# Patient Record
Sex: Male | Born: 1975 | Race: Black or African American | Hispanic: No | Marital: Single | Smoking: Current every day smoker
Health system: Southern US, Community
[De-identification: ages and names within clinical notes are randomized; demographics above are authoritative.]

## PROBLEM LIST (undated history)

## (undated) DIAGNOSIS — I10 Essential (primary) hypertension: Secondary | ICD-10-CM

## (undated) DIAGNOSIS — E119 Type 2 diabetes mellitus without complications: Secondary | ICD-10-CM

---

## 2019-12-11 HISTORY — PX: EXPLORATORY LAPAROTOMY: SUR591

## 2020-04-19 ENCOUNTER — Emergency Department (HOSPITAL_COMMUNITY)
Admission: EM | Admit: 2020-04-19 | Discharge: 2020-04-19 | Payer: Self-pay | Attending: Emergency Medicine | Admitting: Emergency Medicine

## 2020-04-19 ENCOUNTER — Emergency Department (HOSPITAL_COMMUNITY): Admission: EM | Admit: 2020-04-19 | Discharge: 2020-04-19 | Discharge status: Absent without leave | Payer: Self-pay

## 2020-04-19 ENCOUNTER — Encounter (HOSPITAL_COMMUNITY): Payer: Self-pay

## 2020-04-19 ENCOUNTER — Other Ambulatory Visit: Payer: Self-pay

## 2020-04-19 ENCOUNTER — Emergency Department (HOSPITAL_COMMUNITY): Payer: Self-pay

## 2020-04-19 DIAGNOSIS — M25512 Pain in left shoulder: Secondary | ICD-10-CM | POA: Insufficient documentation

## 2020-04-19 DIAGNOSIS — M7918 Myalgia, other site: Secondary | ICD-10-CM | POA: Insufficient documentation

## 2020-04-19 DIAGNOSIS — F17200 Nicotine dependence, unspecified, uncomplicated: Secondary | ICD-10-CM | POA: Insufficient documentation

## 2020-04-19 MED ORDER — METHOCARBAMOL 500 MG PO TABS: 500.0000 mg | ORAL_TABLET | Freq: Two times a day (BID) | ORAL | 0 refills | Status: DC | PRN

## 2020-04-19 MED ORDER — NAPROXEN 500 MG PO TABS
500.0000 mg | ORAL_TABLET | Freq: Once | ORAL | Status: AC
  Administered 2020-04-19: 500 mg via ORAL
  Filled 2020-04-19: qty 1

## 2020-04-19 MED ORDER — METHOCARBAMOL 500 MG PO TABS
500.0000 mg | ORAL_TABLET | Freq: Once | ORAL | Status: AC
  Administered 2020-04-19: 500 mg via ORAL
  Filled 2020-04-19: qty 1
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
## 2020-04-19 NOTE — Discharge Instructions (Signed)
Alternate ice and heat to areas of injury 3-4 times per day to limit inflammation and spasm.  Avoid strenuous activity and heavy lifting.  We recommend consistent use of naproxen in addition to Robaxin for muscle spasms.  Do not drive or drink alcohol after taking Robaxin as it may make you drowsy and impair your judgment.  We recommend follow-up with an Orthopedist if symptoms persist.  Return to the ED for any new or concerning symptoms.

## 2020-06-20 ENCOUNTER — Ambulatory Visit: Payer: Self-pay | Admitting: Family Medicine

## 2021-07-12 ENCOUNTER — Ambulatory Visit (INDEPENDENT_AMBULATORY_CARE_PROVIDER_SITE_OTHER): Payer: Self-pay | Admitting: Primary Care

## 2021-09-17 ENCOUNTER — Ambulatory Visit (INDEPENDENT_AMBULATORY_CARE_PROVIDER_SITE_OTHER): Payer: Self-pay | Admitting: Primary Care

## 2021-10-31 ENCOUNTER — Inpatient Hospital Stay (HOSPITAL_COMMUNITY)
Admission: EM | Admit: 2021-10-31 | Discharge: 2021-11-03 | DRG: 291 | Disposition: A | Discharge status: Home / Self Care | Payer: Self-pay | Attending: Internal Medicine | Admitting: Internal Medicine

## 2021-10-31 ENCOUNTER — Encounter (HOSPITAL_COMMUNITY): Payer: Self-pay | Admitting: Internal Medicine

## 2021-10-31 ENCOUNTER — Emergency Department (HOSPITAL_COMMUNITY): Payer: Self-pay

## 2021-10-31 ENCOUNTER — Other Ambulatory Visit: Payer: Self-pay

## 2021-10-31 DIAGNOSIS — Z9114 Patient's other noncompliance with medication regimen: Secondary | ICD-10-CM

## 2021-10-31 DIAGNOSIS — Z6841 Body Mass Index (BMI) 40.0 and over, adult: Secondary | ICD-10-CM

## 2021-10-31 DIAGNOSIS — K029 Dental caries, unspecified: Secondary | ICD-10-CM | POA: Diagnosis present

## 2021-10-31 DIAGNOSIS — Z8249 Family history of ischemic heart disease and other diseases of the circulatory system: Secondary | ICD-10-CM

## 2021-10-31 DIAGNOSIS — J9601 Acute respiratory failure with hypoxia: Secondary | ICD-10-CM | POA: Diagnosis present

## 2021-10-31 DIAGNOSIS — I248 Other forms of acute ischemic heart disease: Secondary | ICD-10-CM | POA: Diagnosis present

## 2021-10-31 DIAGNOSIS — I110 Hypertensive heart disease with heart failure: Principal | ICD-10-CM | POA: Diagnosis present

## 2021-10-31 DIAGNOSIS — F1010 Alcohol abuse, uncomplicated: Secondary | ICD-10-CM | POA: Diagnosis present

## 2021-10-31 DIAGNOSIS — I5021 Acute systolic (congestive) heart failure: Secondary | ICD-10-CM

## 2021-10-31 DIAGNOSIS — F1410 Cocaine abuse, uncomplicated: Secondary | ICD-10-CM | POA: Diagnosis present

## 2021-10-31 DIAGNOSIS — I160 Hypertensive urgency: Secondary | ICD-10-CM | POA: Diagnosis present

## 2021-10-31 DIAGNOSIS — F329 Major depressive disorder, single episode, unspecified: Secondary | ICD-10-CM | POA: Diagnosis present

## 2021-10-31 DIAGNOSIS — Z20822 Contact with and (suspected) exposure to covid-19: Secondary | ICD-10-CM | POA: Diagnosis present

## 2021-10-31 DIAGNOSIS — K047 Periapical abscess without sinus: Secondary | ICD-10-CM | POA: Diagnosis present

## 2021-10-31 DIAGNOSIS — E1165 Type 2 diabetes mellitus with hyperglycemia: Secondary | ICD-10-CM

## 2021-10-31 DIAGNOSIS — I10 Essential (primary) hypertension: Secondary | ICD-10-CM

## 2021-10-31 DIAGNOSIS — F17210 Nicotine dependence, cigarettes, uncomplicated: Secondary | ICD-10-CM | POA: Diagnosis present

## 2021-10-31 DIAGNOSIS — I509 Heart failure, unspecified: Principal | ICD-10-CM

## 2021-10-31 DIAGNOSIS — Z841 Family history of disorders of kidney and ureter: Secondary | ICD-10-CM

## 2021-10-31 DIAGNOSIS — R1084 Generalized abdominal pain: Secondary | ICD-10-CM | POA: Diagnosis present

## 2021-10-31 DIAGNOSIS — Z833 Family history of diabetes mellitus: Secondary | ICD-10-CM

## 2021-10-31 DIAGNOSIS — N179 Acute kidney failure, unspecified: Secondary | ICD-10-CM

## 2021-10-31 HISTORY — DX: Type 2 diabetes mellitus without complications: E11.9

## 2021-10-31 HISTORY — DX: Essential (primary) hypertension: I10

## 2021-10-31 LAB — RESP PANEL BY RT-PCR (FLU A&B, COVID) ARPGX2
Influenza A by PCR: NEGATIVE
Influenza B by PCR: NEGATIVE
SARS Coronavirus 2 by RT PCR: NEGATIVE

## 2021-10-31 LAB — CBC
HCT: 47 % (ref 39.0–52.0)
HCT: 45.5 % (ref 39.0–52.0)
Hemoglobin: 14.8 g/dL (ref 13.0–17.0)
Hemoglobin: 14.5 g/dL (ref 13.0–17.0)
MCH: 28.7 pg (ref 26.0–34.0)
MCH: 29 pg (ref 26.0–34.0)
MCHC: 31.5 g/dL (ref 30.0–36.0)
MCHC: 31.9 g/dL (ref 30.0–36.0)
MCV: 91.1 fL (ref 80.0–100.0)
MCV: 91 fL (ref 80.0–100.0)
Platelets: 308 10*3/uL (ref 150–400)
Platelets: 284 10*3/uL (ref 150–400)
RBC: 5.16 MIL/uL (ref 4.22–5.81)
RBC: 5 MIL/uL (ref 4.22–5.81)
RDW: 13.4 % (ref 11.5–15.5)
RDW: 13.4 % (ref 11.5–15.5)
WBC: 6.7 10*3/uL (ref 4.0–10.5)
WBC: 6.2 10*3/uL (ref 4.0–10.5)
nRBC: 0 % (ref 0.0–0.2)
nRBC: 0 % (ref 0.0–0.2)

## 2021-10-31 LAB — COMPREHENSIVE METABOLIC PANEL
ALT: 111 U/L — ABNORMAL HIGH (ref 0–44)
AST: 71 U/L — ABNORMAL HIGH (ref 15–41)
Albumin: 3.5 g/dL (ref 3.5–5.0)
Alkaline Phosphatase: 66 U/L (ref 38–126)
Anion gap: 7 (ref 5–15)
BUN: 23 mg/dL — ABNORMAL HIGH (ref 6–20)
CO2: 24 mmol/L (ref 22–32)
Calcium: 9 mg/dL (ref 8.9–10.3)
Chloride: 106 mmol/L (ref 98–111)
Creatinine, Ser: 1.35 mg/dL — ABNORMAL HIGH (ref 0.61–1.24)
GFR, Estimated: >60 mL/min (ref >60)
Glucose, Bld: 212 mg/dL — ABNORMAL HIGH (ref 70–99)
Potassium: 4.3 mmol/L (ref 3.5–5.1)
Sodium: 137 mmol/L (ref 135–145)
Total Bilirubin: 0.9 mg/dL (ref 0.3–1.2)
Total Protein: 7.2 g/dL (ref 6.5–8.1)

## 2021-10-31 LAB — TROPONIN I (HIGH SENSITIVITY)
Troponin I (High Sensitivity): 34 ng/L — ABNORMAL HIGH (ref <18)
Troponin I (High Sensitivity): 37 ng/L — ABNORMAL HIGH (ref <18)
Troponin I (High Sensitivity): 34 ng/L — ABNORMAL HIGH (ref <18)
Troponin I (High Sensitivity): 40 ng/L — ABNORMAL HIGH (ref <18)

## 2021-10-31 LAB — D-DIMER, QUANTITATIVE: D-Dimer, Quant: 1.38 ug/mL-FEU — ABNORMAL HIGH (ref 0.00–0.50)

## 2021-10-31 LAB — CREATININE, SERUM
Creatinine, Ser: 1.32 mg/dL — ABNORMAL HIGH (ref 0.61–1.24)
GFR, Estimated: >60 mL/min (ref >60)

## 2021-10-31 LAB — HEMOGLOBIN A1C
Hgb A1c MFr Bld: 7.9 % — ABNORMAL HIGH (ref 4.8–5.6)
Mean Plasma Glucose: 180.03 mg/dL

## 2021-10-31 LAB — LIPASE, BLOOD: Lipase: 42 U/L (ref 11–51)

## 2021-10-31 LAB — GLUCOSE, CAPILLARY: Glucose-Capillary: 266 mg/dL — ABNORMAL HIGH (ref 70–99)

## 2021-10-31 LAB — MAGNESIUM: Magnesium: 1.9 mg/dL (ref 1.7–2.4)

## 2021-10-31 LAB — BRAIN NATRIURETIC PEPTIDE: B Natriuretic Peptide: 777.6 pg/mL — ABNORMAL HIGH (ref 0.0–100.0)

## 2021-10-31 MED ORDER — NITROGLYCERIN 0.4 MG SL SUBL
0.4000 mg | SUBLINGUAL_TABLET | SUBLINGUAL | Status: DC | PRN
  Administered 2021-10-31: 0.4 mg via SUBLINGUAL
  Filled 2021-10-31 (×2): qty 1

## 2021-10-31 MED ORDER — INSULIN ASPART 100 UNIT/ML IJ SOLN
0.0000 [IU] | Freq: Three times a day (TID) | INTRAMUSCULAR | Status: DC
  Administered 2021-11-01: 3 [IU] via SUBCUTANEOUS
  Administered 2021-11-01 – 2021-11-02 (×4): 2 [IU] via SUBCUTANEOUS
  Administered 2021-11-03: 3 [IU] via SUBCUTANEOUS

## 2021-10-31 MED ORDER — FUROSEMIDE 10 MG/ML IJ SOLN
40.0000 mg | Freq: Two times a day (BID) | INTRAMUSCULAR | Status: DC
  Administered 2021-11-01 – 2021-11-03 (×5): 40 mg via INTRAVENOUS
  Filled 2021-10-31 (×5): qty 4

## 2021-10-31 MED ORDER — FUROSEMIDE 10 MG/ML IJ SOLN
20.0000 mg | Freq: Once | INTRAMUSCULAR | Status: AC
  Administered 2021-10-31: 20 mg via INTRAVENOUS
  Filled 2021-10-31: qty 4

## 2021-10-31 MED ORDER — NICOTINE 7 MG/24HR TD PT24
7.0000 mg | MEDICATED_PATCH | Freq: Once | TRANSDERMAL | Status: AC
  Administered 2021-11-01: 7 mg via TRANSDERMAL
  Filled 2021-10-31: qty 1

## 2021-10-31 MED ORDER — CARVEDILOL 6.25 MG PO TABS
6.2500 mg | ORAL_TABLET | Freq: Two times a day (BID) | ORAL | Status: DC
  Administered 2021-11-01: 6.25 mg via ORAL
  Filled 2021-10-31: qty 1

## 2021-10-31 MED ORDER — CARVEDILOL 3.125 MG PO TABS
6.2500 mg | ORAL_TABLET | Freq: Once | ORAL | Status: AC
  Administered 2021-10-31: 6.25 mg via ORAL
  Filled 2021-10-31 (×2): qty 2

## 2021-10-31 MED ORDER — IOHEXOL 350 MG/ML SOLN
100.0000 mL | Freq: Once | INTRAVENOUS | Status: AC | PRN
  Administered 2021-10-31: 100 mL via INTRAVENOUS

## 2021-10-31 MED ORDER — METHOCARBAMOL 500 MG PO TABS: 500.0000 mg | ORAL_TABLET | Freq: Two times a day (BID) | ORAL | Status: DC | PRN

## 2021-10-31 MED ORDER — SODIUM CHLORIDE 0.9 % IV SOLN: 250.0000 mL | INTRAVENOUS | Status: DC | PRN

## 2021-10-31 MED ORDER — SODIUM CHLORIDE 0.9% FLUSH: 3.0000 mL | INTRAVENOUS | Status: DC | PRN

## 2021-10-31 MED ORDER — SODIUM CHLORIDE 0.9 % IV BOLUS
500.0000 mL | Freq: Once | INTRAVENOUS | Status: AC
  Administered 2021-10-31: 500 mL via INTRAVENOUS

## 2021-10-31 MED ORDER — INSULIN ASPART 100 UNIT/ML IJ SOLN
3.0000 [IU] | Freq: Three times a day (TID) | INTRAMUSCULAR | Status: DC
  Administered 2021-11-01 – 2021-11-03 (×7): 3 [IU] via SUBCUTANEOUS

## 2021-10-31 MED ORDER — INSULIN DETEMIR 100 UNIT/ML ~~LOC~~ SOLN
5.0000 [IU] | Freq: Every day | SUBCUTANEOUS | Status: DC
  Administered 2021-10-31 – 2021-11-01 (×2): 5 [IU] via SUBCUTANEOUS
  Filled 2021-10-31 (×2): qty 0.05

## 2021-10-31 MED ORDER — SODIUM CHLORIDE 0.9% FLUSH
3.0000 mL | Freq: Two times a day (BID) | INTRAVENOUS | Status: DC
  Administered 2021-11-01 – 2021-11-03 (×5): 3 mL via INTRAVENOUS

## 2021-10-31 MED ORDER — SACUBITRIL-VALSARTAN 24-26 MG PO TABS
1.0000 | ORAL_TABLET | Freq: Two times a day (BID) | ORAL | Status: DC
  Administered 2021-10-31: 1 via ORAL
  Filled 2021-10-31 (×2): qty 1

## 2021-10-31 MED ORDER — INSULIN ASPART 100 UNIT/ML IJ SOLN
0.0000 [IU] | Freq: Every day | INTRAMUSCULAR | Status: DC
  Administered 2021-10-31: 3 [IU] via SUBCUTANEOUS

## 2021-10-31 MED ORDER — ACETAMINOPHEN 325 MG PO TABS
650.0000 mg | ORAL_TABLET | ORAL | Status: DC | PRN
  Administered 2021-11-03: 650 mg via ORAL
  Filled 2021-10-31: qty 2

## 2021-10-31 MED ORDER — ASPIRIN 81 MG PO CHEW
324.0000 mg | CHEWABLE_TABLET | Freq: Once | ORAL | Status: AC
  Administered 2021-10-31: 324 mg via ORAL
  Filled 2021-10-31: qty 4

## 2021-10-31 MED ORDER — ONDANSETRON HCL 4 MG/2ML IJ SOLN: 4.0000 mg | Freq: Four times a day (QID) | INTRAMUSCULAR | Status: DC | PRN

## 2021-10-31 MED ORDER — ENOXAPARIN SODIUM 40 MG/0.4ML IJ SOSY
40.0000 mg | PREFILLED_SYRINGE | INTRAMUSCULAR | Status: DC
  Administered 2021-10-31 – 2021-11-02 (×3): 40 mg via SUBCUTANEOUS
  Filled 2021-10-31 (×3): qty 0.4

## 2021-10-31 MED ORDER — SODIUM CHLORIDE (PF) 0.9 % IJ SOLN
INTRAMUSCULAR | Status: AC
  Filled 2021-10-31: qty 50

## 2021-10-31 MED ORDER — ASPIRIN EC 81 MG PO TBEC
81.0000 mg | DELAYED_RELEASE_TABLET | Freq: Every day | ORAL | Status: DC
  Administered 2021-10-31 – 2021-11-03 (×4): 81 mg via ORAL
  Filled 2021-10-31 (×4): qty 1
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
## 2021-10-31 NOTE — H&P (Signed)
? ? ?History and Physical ? ?[name] SK:[id number] DOB: [birth year] DOA: 10/31/2021 ? ?PCP: Patient, No Pcp Per (Inactive) ?Patient coming from: home ? ?I have personally briefly reviewed patient's old medical records in [place] ? ? ?Chief Complaint: SOB ? ?HPI: Nicholas Hicks is a 46 y.o. male past medical history of diabetes and hypertension, major depressive disorder who does not have a PCP and has been noncompliant with his medication for over 2 years.  He relates he started getting short of breath about 7 days prior to admission now he is even short of breath at rest is orthopneic at home.  Denies any swelling, some chest pressure but no exertional chest pain.  He does relate exertional shortness of breath.  Denies any fever, cough, diarrhea new medications or sick contacts. ? ?In the ED: ?Found to be hypertensive tachycardic tachypneic with a heart rate of 115, creatinine 1.3 BNP of 700, CT angio of the chest was negative for PE but it did show pulmonary edema SARS-CoV-2 and influenza PCR were negative.  CT scan of the abdomen and pelvis social mild stranding in the duodenum but he has remained afebrile with no leukocytosis ? ? ?Review of Systems: All systems reviewed and apart from history of presenting illness, are negative. ? ?Past Medical History:  ?Diagnosis Date  ? Diabetes mellitus without complication ([place])   ? Hypertension   ? ?No past surgical history on file. ?Social History:  reports that he has been smoking cigarettes. He has been smoking an average of .25 packs per day. He has never used smokeless tobacco. He reports current alcohol use of about 10.0 standard drinks per week. He reports current drug use. ? ? ?Not on File ? ?Family History  ?Problem Relation Age of Onset  ? Diabetes type II Mother   ? Congestive Heart Failure Father   ? Renal Disease Father   ? ? ?Prior to Admission medications   ?Medication Sig Start Date End Date Taking? Authorizing Provider  ?methocarbamol  (ROBAXIN) 500 MG tablet Take 1 tablet (500 mg total) by mouth every 12 (twelve) hours as needed for muscle spasms (for shoulder pain). 04/19/20   Antonietta Breach, PA-C  ? ?Physical Exam: ?Vitals:  ? 10/31/21 1300 10/31/21 1330 10/31/21 1500 10/31/21 1600  ?BP: (!) 184/123 (!) 186/125 (!) 153/101 (!) 162/129  ?Pulse: (!) 106 93 93 (!) 105  ?Resp: (!) 22 18 18 20   ?Temp:      ?TempSrc:      ?SpO2: 96% 97% 92% 97%  ?Weight:      ?Height:      ? ? ?General exam: Moderately built and nourished patient, lying comfortably supine on the gurney in no obvious distress. ?Head, eyes and ENT: Nontraumatic and normocephalic.  ?Neck: Supple.  Cannot appreciate JVD ?Lymphatics: No lymphadenopathy. ?Respiratory system: Good air movement with crackles at bases ?Cardiovascular system: S1 and S2 heard, RRR.  ?Gastrointestinal system: Abdomen is nondistended, soft and nontender. Normal bowel sounds heard. No organomegaly or masses appreciated. ?Central nervous system: Alert and oriented. No focal neurological deficits. ?Extremities: No lower extremity edema ?Skin: No rashes or acute findings. ?Musculoskeletal system: Negative exam. ?Psychiatry: Pleasant and cooperative. ? ? ?Labs on Admission:  ?Basic Metabolic Panel: ?Recent Labs  ?Lab 10/31/21 ?1220  ?NA 137  ?K 4.3  ?CL 106  ?CO2 24  ?GLUCOSE 212*  ?BUN 23*  ?CREATININE 1.35*  ?CALCIUM 9.0  ? ?Liver Function Tests: ?Recent Labs  ?Lab 10/31/21 ?1220  ?AST 71*  ?ALT  111*  ?ALKPHOS 66  ?BILITOT 0.9  ?PROT 7.2  ?ALBUMIN 3.5  ? ?Recent Labs  ?Lab 10/31/21 ?1220  ?LIPASE 42  ? ?No results for input(s): AMMONIA in the last 168 hours. ?CBC: ?Recent Labs  ?Lab 10/31/21 ?1220  ?WBC 6.7  ?HGB 14.8  ?HCT 47.0  ?MCV 91.1  ?PLT 308  ? ?Cardiac Enzymes: ?No results for input(s): CKTOTAL, CKMB, CKMBINDEX, TROPONINI in the last 168 hours. ? ?BNP (last 3 results) ?No results for input(s): PROBNP in the last 8760 hours. ?CBG: ?No results for input(s): GLUCAP in the last 168 hours. ? ?Radiological Exams on  Admission: ?DG Chest 2 View ? ?Result Date: 10/31/2021 ?CLINICAL DATA:  chest pain EXAM: CHEST - 2 VIEW COMPARISON:  July 05, 2018. FINDINGS: Mild interstitial opacities bilaterally. No visible pleural effusions or pneumothorax. Enlarged cardiac silhouette and pulmonary vascular congestion. IMPRESSION: 1. Mild interstitial opacities bilaterally, potentially mild interstitial pulmonary edema or the sequela of recurrent bouts of CHF. Atypical infection is thought less likely. 2. Cardiomegaly and pulmonary vascular congestion. Electronically Signed   By: Margaretha Sheffield M.D.   On: 10/31/2021 12:06  ? ?CT Angio Chest PE W and/or Wo Contrast ? ?Result Date: 10/31/2021 ?CLINICAL DATA:  Pulmonary embolism (PE) suspected, positive D-dimer; Abdominal pain, acute, nonlocalized EXAM: CT ANGIOGRAPHY CHEST CT ABDOMEN AND PELVIS WITH CONTRAST TECHNIQUE: Multidetector CT imaging of the chest was performed using the standard protocol during bolus administration of intravenous contrast. Multiplanar CT image reconstructions and MIPs were obtained to evaluate the vascular anatomy. Multidetector CT imaging of the abdomen and pelvis was performed using the standard protocol during bolus administration of intravenous contrast. RADIATION DOSE REDUCTION: This exam was performed according to the departmental dose-optimization program which includes automated exposure control, adjustment of the mA and/or kV according to patient size and/or use of iterative reconstruction technique. CONTRAST:  178mL OMNIPAQUE IOHEXOL 350 MG/ML SOLN COMPARISON:  CT abdomen pelvis 02/21/2016 FINDINGS: CTA CHEST FINDINGS Cardiovascular: Satisfactory opacification of the pulmonary mild cardiomegaly. No pericardial disease. The thoracic aorta is unremarkable. Normal heart size. No pericardial effusion. Mediastinum/Nodes: Prominent mediastinal and hilar lymph nodes, likely reactive. Lungs/Pleura: Mosaic attenuation of the lungs with mild interlobular septal  thickening. There is a 5 mm right middle lobe pulmonary nodule (series 7, image 78). There is diffuse mild bronchial wall thickening. Small right pleural effusion. No pneumothorax. Musculoskeletal: No acute osseous abnormality. No suspicious lytic or blastic lesions. Review of the MIP images confirms the above findings. CT ABDOMEN and PELVIS FINDINGS Hepatobiliary: Hepatic steatosis. Nonspecific gallbladder wall thickening. The gallbladder is nondilated. Pancreas: Unremarkable. No pancreatic ductal dilatation or surrounding inflammatory changes. Spleen: Normal in size without focal abnormality. Adrenals/Urinary Tract: Adrenal glands are unremarkable. Multifocal renal cortical thinning/scarring in the lower poles. There are small subcentimeter bilateral renal cysts. There is no hydronephrosis or nephrolithiasis. The bladder is unremarkable. Stomach/Bowel: The stomach is within normal limits. There is mild stranding along the descending duodenum (sagittal image 65, axial image 42). There is no evidence of bowel obstruction.The appendix is normal. Vascular/Lymphatic: No significant vascular findings are present. No enlarged abdominal or pelvic lymph nodes. Reproductive: Unremarkable. Other: Prior midline incision. No abdominopelvic ascites. No free air. Musculoskeletal: No acute or significant osseous findings. Review of the MIP images confirms the above findings. IMPRESSION: Mosaic attenuation of the lungs with mild interlobular septal thickening could represent pulmonary edema and/or small airways disease. Small right pleural effusion. No evidence of pulmonary embolism. Mild stranding along the descending duodenum, possibly representing focal duodenitis. Hepatic  steatosis. Nonspecific gallbladder wall thickening but possibly related to hepatic disease or volume overload. Electronically Signed   By: Maurine Simmering M.D.   On: 10/31/2021 14:47  ? ?CT ABDOMEN PELVIS W CONTRAST ? ?Result Date: 10/31/2021 ?CLINICAL DATA:   Pulmonary embolism (PE) suspected, positive D-dimer; Abdominal pain, acute, nonlocalized EXAM: CT ANGIOGRAPHY CHEST CT ABDOMEN AND PELVIS WITH CONTRAST TECHNIQUE: Multidetector CT imaging of the chest was performed u
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
## 2021-10-31 NOTE — ED Provider Notes (Signed)
I provided a substantive portion of the care of this patient.  I personally performed the entirety of the history for this encounter. ? ?EKG Interpretation ? ?Date/Time:  Wednesday October 31 2021 11:50:30 EDT ?Ventricular Rate:  99 ?PR Interval:  130 ?QRS Duration: 87 ?QT Interval:  333 ?QTC Calculation: 428 ?R Axis:   -8 ?Text Interpretation: Sinus rhythm Probable left atrial enlargement Consider anterior infarct nonspecific t wave flattening, no STEMI. No old comparison Confirmed by Charlesetta Shanks [phone]) on 10/31/2021 12:33:03 PM  ? ?Has been noticing increased shortness of breath and chest tightness for about 2 weeks.  He denies he has noticed any swelling in his legs.  He reports when it very first started it woke him from sleep.  Since then he feels short of breath with exertion even at rest.  He gets a tight and uncomfortable sensation in his chest.  No prior cardiac history. ? ?Patient is alert.  He is sitting at edge of stretcher.  Mild tachypnea.  Monitor at bedside shows heart rate of 107.  Lungs are grossly clear with softer breath sounds at bases.  Heart is tachycardic I cannot appreciate gross rub or gallop.  Abdomen is soft.  No significant peripheral edema. ? ?Patient has significant risk factors for ACS.  With chest pain, shortness of breath and T wave abnormalities concerning for ischemia on EKG, plan for admission.  Agree with plan of management ?  ?Charlesetta Shanks, MD ?10/31/21 1556 ? ?

## 2021-11-01 ENCOUNTER — Inpatient Hospital Stay (HOSPITAL_COMMUNITY): Payer: Self-pay

## 2021-11-01 ENCOUNTER — Encounter (HOSPITAL_COMMUNITY): Payer: Self-pay | Admitting: Internal Medicine

## 2021-11-01 DIAGNOSIS — R0609 Other forms of dyspnea: Secondary | ICD-10-CM

## 2021-11-01 LAB — ECHOCARDIOGRAM COMPLETE
AR max vel: 2.79 cm2
AV Area VTI: 2.87 cm2
AV Area mean vel: 2.6 cm2
AV Mean grad: 2 mmHg
AV Peak grad: 3.8 mmHg
Ao pk vel: 0.98 m/s
Area-P 1/2: 5.66 cm2
Calc EF: 34.8 %
Height: 67 in
MV M vel: 2.93 m/s
MV Peak grad: 34.3 mmHg
S' Lateral: 5.1 cm
Single Plane A2C EF: 25.2 %
Single Plane A4C EF: 44.6 %
Weight: 4119.96 oz

## 2021-11-01 LAB — GLUCOSE, CAPILLARY
Glucose-Capillary: 166 mg/dL — ABNORMAL HIGH (ref 70–99)
Glucose-Capillary: 229 mg/dL — ABNORMAL HIGH (ref 70–99)
Glucose-Capillary: 117 mg/dL — ABNORMAL HIGH (ref 70–99)
Glucose-Capillary: 199 mg/dL — ABNORMAL HIGH (ref 70–99)

## 2021-11-01 LAB — BASIC METABOLIC PANEL
Anion gap: 8 (ref 5–15)
BUN: 21 mg/dL — ABNORMAL HIGH (ref 6–20)
CO2: 23 mmol/L (ref 22–32)
Calcium: 8.9 mg/dL (ref 8.9–10.3)
Chloride: 106 mmol/L (ref 98–111)
Creatinine, Ser: 1.17 mg/dL (ref 0.61–1.24)
GFR, Estimated: >60 mL/min (ref >60)
Glucose, Bld: 174 mg/dL — ABNORMAL HIGH (ref 70–99)
Potassium: 4.1 mmol/L (ref 3.5–5.1)
Sodium: 137 mmol/L (ref 135–145)

## 2021-11-01 LAB — HIV ANTIBODY (ROUTINE TESTING W REFLEX): HIV Screen 4th Generation wRfx: NONREACTIVE

## 2021-11-01 MED ORDER — AMOXICILLIN-POT CLAVULANATE 875-125 MG PO TABS
1.0000 | ORAL_TABLET | Freq: Two times a day (BID) | ORAL | Status: DC
  Administered 2021-11-01 – 2021-11-03 (×5): 1 via ORAL
  Filled 2021-11-01 (×5): qty 1

## 2021-11-01 MED ORDER — SACUBITRIL-VALSARTAN 49-51 MG PO TABS
1.0000 | ORAL_TABLET | Freq: Two times a day (BID) | ORAL | Status: DC
  Administered 2021-11-01 (×2): 1 via ORAL
  Filled 2021-11-01 (×3): qty 1

## 2021-11-01 MED ORDER — SPIRONOLACTONE 25 MG PO TABS
25.0000 mg | ORAL_TABLET | Freq: Every day | ORAL | Status: DC
  Administered 2021-11-01 – 2021-11-03 (×3): 25 mg via ORAL
  Filled 2021-11-01 (×3): qty 1

## 2021-11-01 MED ORDER — CARVEDILOL 12.5 MG PO TABS
12.5000 mg | ORAL_TABLET | Freq: Two times a day (BID) | ORAL | Status: DC
  Administered 2021-11-01 – 2021-11-03 (×4): 12.5 mg via ORAL
  Filled 2021-11-01 (×4): qty 1

## 2021-11-01 NOTE — Plan of Care (Signed)

## 2021-11-01 NOTE — Progress Notes (Addendum)
[facility] ?PROGRESS NOTE ? ? ? ?Progress Note  ?[name]  [id number] DOB: 1975/12/17 DOA: 10/31/2021 ?PCP: Patient, No Pcp Per (Inactive)  ? ? ? ?Brief Narrative:  ? ?Anant Tyrell is an 46 y.o. male past medical history significant for diabetes mellitus, essential hypertension major depressive disorder who has not seen a PCP in over 2 years came in with shortness of breath was found to be in pulmonary edema probably due to acute decompensated heart failure ? ? ?Assessment/Plan:  ? ?Acute hypoxemic respiratory failure secondary to acute coronary artery decompensated heart failure: ?He was started on IV Lasix, Coreg and Entresto. ?His blood pressure improved. ?I's and O wise he is about even.  I's and O's are poorly recorded. ?2D echo is pending to determine further evaluation ?Appreciate cardiology's assistance. ?Start low-dose Aldactone ? ?Hypertensive urgency: ?Currently on Coreg Lasix and Entresto, his diastolic is still significantly elevated, continue IV Lasix ? ?Elevated troponins: ?Likely demand ischemia in the setting of acute decompensated heart failure, cardiac biomarkers basically remained flat. ?2D echo is pending. ? ?AKI (acute kidney injury) ([place]) ?Improve with IV diuresis and control of his blood pressure.  Continue to titrate antihypertensive medications see acute decompensated heart failure for further details. ? ?Uncontrolled type 2 diabetes mellitus with hyperglycemia, without long-term current use of insulin  ?A1c of 7.9, go ahead and increase her long-acting insulin continue sliding scale insulin. ? ?Dental cavity: ?He related he was taking oral Augmentin as an outpatient for 2 days due to a dental infection: Resume Augmentin for total of 7 days. ?He has an appointment with his dentist as an outpatient. ? ?DVT prophylaxis: lovenox ?Family Communication:none ?Status is: Inpatient ?Remains inpatient appropriate because: Acute decompensated heart failure ? ? ? ?Code Status:  ? ?   ?Code Status Orders  ?(From admission, onward)  ?  ? ? ?  ? ?  Start     Ordered  ? 10/31/21 2042  Full code  Continuous       ? 10/31/21 2041  ? ?  ?  ? ?  ? ?Code Status History   ? ? This patient has a current code status but no historical code status.  ? ?  ? ? ? ? ?IV Access:  ? ?Peripheral IV ? ? ?Procedures and diagnostic studies:  ? ?DG Chest 2 View ? ?Result Date: 10/31/2021 ?CLINICAL DATA:  chest pain EXAM: CHEST - 2 VIEW COMPARISON:  July 05, 2018. FINDINGS: Mild interstitial opacities bilaterally. No visible pleural effusions or pneumothorax. Enlarged cardiac silhouette and pulmonary vascular congestion. IMPRESSION: 1. Mild interstitial opacities bilaterally, potentially mild interstitial pulmonary edema or the sequela of recurrent bouts of CHF. Atypical infection is thought less likely. 2. Cardiomegaly and pulmonary vascular congestion. Electronically Signed   By: Margaretha Sheffield M.D.   On: 10/31/2021 12:06  ? ?CT Angio Chest PE W and/or Wo Contrast ? ?Result Date: 10/31/2021 ?CLINICAL DATA:  Pulmonary embolism (PE) suspected, positive D-dimer; Abdominal pain, acute, nonlocalized EXAM: CT ANGIOGRAPHY CHEST CT ABDOMEN AND PELVIS WITH CONTRAST TECHNIQUE: Multidetector CT imaging of the chest was performed using the standard protocol during bolus administration of intravenous contrast. Multiplanar CT image reconstructions and MIPs were obtained to evaluate the vascular anatomy. Multidetector CT imaging of the abdomen and pelvis was performed using the standard protocol during bolus administration of intravenous contrast. RADIATION DOSE REDUCTION: This exam was performed according to the departmental dose-optimization program which includes automated exposure control, adjustment of the mA and/or kV according to patient size  and/or use of iterative reconstruction technique. CONTRAST:  151mL OMNIPAQUE IOHEXOL 350 MG/ML SOLN COMPARISON:  CT abdomen pelvis 02/21/2016 FINDINGS: CTA CHEST FINDINGS  Cardiovascular: Satisfactory opacification of the pulmonary mild cardiomegaly. No pericardial disease. The thoracic aorta is unremarkable. Normal heart size. No pericardial effusion. Mediastinum/Nodes: Prominent mediastinal and hilar lymph nodes, likely reactive. Lungs/Pleura: Mosaic attenuation of the lungs with mild interlobular septal thickening. There is a 5 mm right middle lobe pulmonary nodule (series 7, image 78). There is diffuse mild bronchial wall thickening. Small right pleural effusion. No pneumothorax. Musculoskeletal: No acute osseous abnormality. No suspicious lytic or blastic lesions. Review of the MIP images confirms the above findings. CT ABDOMEN and PELVIS FINDINGS Hepatobiliary: Hepatic steatosis. Nonspecific gallbladder wall thickening. The gallbladder is nondilated. Pancreas: Unremarkable. No pancreatic ductal dilatation or surrounding inflammatory changes. Spleen: Normal in size without focal abnormality. Adrenals/Urinary Tract: Adrenal glands are unremarkable. Multifocal renal cortical thinning/scarring in the lower poles. There are small subcentimeter bilateral renal cysts. There is no hydronephrosis or nephrolithiasis. The bladder is unremarkable. Stomach/Bowel: The stomach is within normal limits. There is mild stranding along the descending duodenum (sagittal image 65, axial image 42). There is no evidence of bowel obstruction.The appendix is normal. Vascular/Lymphatic: No significant vascular findings are present. No enlarged abdominal or pelvic lymph nodes. Reproductive: Unremarkable. Other: Prior midline incision. No abdominopelvic ascites. No free air. Musculoskeletal: No acute or significant osseous findings. Review of the MIP images confirms the above findings. IMPRESSION: Mosaic attenuation of the lungs with mild interlobular septal thickening could represent pulmonary edema and/or small airways disease. Small right pleural effusion. No evidence of pulmonary embolism. Mild  stranding along the descending duodenum, possibly representing focal duodenitis. Hepatic steatosis. Nonspecific gallbladder wall thickening but possibly related to hepatic disease or volume overload. Electronically Signed   By: Maurine Simmering M.D.   On: 10/31/2021 14:47  ? ?CT ABDOMEN PELVIS W CONTRAST ? ?Result Date: 10/31/2021 ?CLINICAL DATA:  Pulmonary embolism (PE) suspected, positive D-dimer; Abdominal pain, acute, nonlocalized EXAM: CT ANGIOGRAPHY CHEST CT ABDOMEN AND PELVIS WITH CONTRAST TECHNIQUE: Multidetector CT imaging of the chest was performed using the standard protocol during bolus administration of intravenous contrast. Multiplanar CT image reconstructions and MIPs were obtained to evaluate the vascular anatomy. Multidetector CT imaging of the abdomen and pelvis was performed using the standard protocol during bolus administration of intravenous contrast. RADIATION DOSE REDUCTION: This exam was performed according to the departmental dose-optimization program which includes automated exposure control, adjustment of the mA and/or kV according to patient size and/or use of iterative reconstruction technique. CONTRAST:  129mL OMNIPAQUE IOHEXOL 350 MG/ML SOLN COMPARISON:  CT abdomen pelvis 02/21/2016 FINDINGS: CTA CHEST FINDINGS Cardiovascular: Satisfactory opacification of the pulmonary mild cardiomegaly. No pericardial disease. The thoracic aorta is unremarkable. Normal heart size. No pericardial effusion. Mediastinum/Nodes: Prominent mediastinal and hilar lymph nodes, likely reactive. Lungs/Pleura: Mosaic attenuation of the lungs with mild interlobular septal thickening. There is a 5 mm right middle lobe pulmonary nodule (series 7, image 78). There is diffuse mild bronchial wall thickening. Small right pleural effusion. No pneumothorax. Musculoskeletal: No acute osseous abnormality. No suspicious lytic or blastic lesions. Review of the MIP images confirms the above findings. CT ABDOMEN and PELVIS FINDINGS  Hepatobiliary: Hepatic steatosis. Nonspecific gallbladder wall thickening. The gallbladder is nondilated. Pancreas: Unremarkable. No pancreatic ductal dilatation or surrounding inflammatory changes. Spleen: Normal in siz

## 2021-11-01 NOTE — TOC Initial Note (Signed)
Transition of Care (TOC) - Initial/Assessment Note  ? ? ?Patient Details  ?Name: Nicholas Hicks ?MRN: [id number] ?Date of Birth: 1975-11-07 ? ?Transition of Care (TOC) CM/SW Contact:    ?Tawanna Cooler, RN ?Phone Number: ?11/01/2021, 1:17 PM ? ?Clinical Narrative:                 ? ?Met with patient at bedside. He is uninsured, confirms he does not have a PCP.  Is agreeable to [facility] CM scheduling a PCP appt for him at one of the community clinics.  ? ?Appt scheduled for November 19, 2021 at 2:30 pm at [facility].  Appt added to the discharge paperwork and patient made aware.  ? ? ?Expected Discharge Plan: Home/Self Care ?Barriers to Discharge: Continued Medical Work up ? ? ?Expected Discharge Plan and Services ?Expected Discharge Plan: Home/Self Care ?  ?  ?  ?Living arrangements for the past 2 months: Single Family Home ?                ?  ? ?Prior Living Arrangements/Services ?Living arrangements for the past 2 months: [place] ?Lives with:: Parents ?Patient language and need for interpreter reviewed:: Yes ?Do you feel safe going back to the place where you live?: Yes      ?Need for Family Participation in Patient Care: Yes (Comment) ?Care giver support system in place?: Yes (comment) ?  ?Criminal Activity/Legal Involvement Pertinent to Current Situation/Hospitalization: No - Comment as needed ? ?Activities of Daily Living ?Home Assistive Devices/Equipment: Eyeglasses, CBG Meter ?ADL Screening (condition at time of admission) ?Patient's cognitive ability adequate to safely complete daily activities?: Yes ?Is the patient deaf or have difficulty hearing?: No ?Does the patient have difficulty seeing, even when wearing glasses/contacts?: Yes (blurry vision and is worse at night) ?Does the patient have difficulty concentrating, remembering, or making decisions?: Yes (at times) ?Patient able to express need for assistance with ADLs?: No ?Does the patient have difficulty  dressing or bathing?: No ?Independently performs ADLs?: Yes (appropriate for developmental age) ?Does the patient have difficulty walking or climbing stairs?: No ?Weakness of Legs: Both ?Weakness of Arms/Hands: Both ? ?Orientation: : Oriented to Self, Oriented to Place, Oriented to  Time, Oriented to Situation ?Alcohol / Substance Use: Not Applicable ?Psych Involvement: No (comment) ? ?Admission diagnosis:  Acute clinical systolic heart failure ([place]) [I50.21] ?Acute hypoxemic respiratory failure ([place]) [J96.01] ?Hypertension, unspecified type [I10] ?Acute congestive heart failure, unspecified heart failure type ([place]) [I50.9] ?Patient Active Problem List  ? Diagnosis Date Noted  ? Acute hypoxemic respiratory failure ([place]) 10/31/2021  ? Acute clinical systolic heart failure ([place]) 10/31/2021  ? AKI (acute kidney injury) ([place]) 10/31/2021  ? Uncontrolled type 2 diabetes mellitus with hyperglycemia, without long-term current use of insulin ([place]) 10/31/2021  ? ?PCP:  Patient, No Pcp Per (Inactive) ?Pharmacy:   ?[id number] - [place], [address] ?[place] ?[place] [address] ?Phone: [phone] Fax: [phone] ? ?
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
## 2021-11-01 NOTE — Progress Notes (Signed)
? ?  Echocardiogram ?2D Echocardiogram has been performed. ? ?Festus Barren ?11/01/2021, 1:31 PM ?

## 2021-11-02 LAB — GLUCOSE, CAPILLARY
Glucose-Capillary: 197 mg/dL — ABNORMAL HIGH (ref 70–99)
Glucose-Capillary: 192 mg/dL — ABNORMAL HIGH (ref 70–99)
Glucose-Capillary: 160 mg/dL — ABNORMAL HIGH (ref 70–99)
Glucose-Capillary: 178 mg/dL — ABNORMAL HIGH (ref 70–99)

## 2021-11-02 LAB — BASIC METABOLIC PANEL
Anion gap: 7 (ref 5–15)
BUN: 23 mg/dL — ABNORMAL HIGH (ref 6–20)
CO2: 26 mmol/L (ref 22–32)
Calcium: 8.9 mg/dL (ref 8.9–10.3)
Chloride: 103 mmol/L (ref 98–111)
Creatinine, Ser: 1.19 mg/dL (ref 0.61–1.24)
GFR, Estimated: >60 mL/min (ref >60)
Glucose, Bld: 213 mg/dL — ABNORMAL HIGH (ref 70–99)
Potassium: 4.1 mmol/L (ref 3.5–5.1)
Sodium: 136 mmol/L (ref 135–145)

## 2021-11-02 MED ORDER — SACUBITRIL-VALSARTAN 97-103 MG PO TABS
1.0000 | ORAL_TABLET | Freq: Two times a day (BID) | ORAL | Status: DC
  Administered 2021-11-02 – 2021-11-03 (×3): 1 via ORAL
  Filled 2021-11-02 (×3): qty 1

## 2021-11-02 MED ORDER — EMPAGLIFLOZIN 10 MG PO TABS
10.0000 mg | ORAL_TABLET | Freq: Every day | ORAL | Status: DC
  Administered 2021-11-02 – 2021-11-03 (×2): 10 mg via ORAL
  Filled 2021-11-02 (×2): qty 1

## 2021-11-02 MED ORDER — INSULIN DETEMIR 100 UNIT/ML ~~LOC~~ SOLN
5.0000 [IU] | Freq: Two times a day (BID) | SUBCUTANEOUS | Status: DC
  Administered 2021-11-02 (×2): 5 [IU] via SUBCUTANEOUS
  Filled 2021-11-02 (×3): qty 0.05

## 2021-11-02 NOTE — Progress Notes (Addendum)
[facility] ?PROGRESS NOTE ? ? ? ?Progress Note  ?Nicholas Hicks  RUE:[id number] DOB: 28-Jan-1976 DOA: 10/31/2021 ?PCP: Patient, No Pcp Per (Inactive)  ? ? ? ?Brief Narrative:  ? ?Nicholas Hicks is an 46 y.o. male past medical history significant for diabetes mellitus, essential hypertension major depressive disorder who has not seen a PCP in over 2 years came in with shortness of breath was found to be in pulmonary edema probably due to acute decompensated heart failure ? ? ?Assessment/Plan:  ? ?Acute systolic decompensated heart failure: ?Acute respiratory failure has been rule out. ?He was started on IV Lasix, Coreg and Entresto. ?Blood pressure is improved, he is negative of 4-1/2 L. ?2D echo showed an EF of 25%, with global hypokinesia and grade 2 diastolic dysfunction valves are intact. ?Continue strict I's and O's and daily weights. ? ?Hypertensive urgency: ?Currently on Coreg Lasix and Entresto, his diastolic is still significantly elevated, continue IV Lasix ? ?Elevated troponins: ?Likely demand ischemia in the setting of acute decompensated heart failure, cardiac biomarkers basically remained flat. ?2D echo is an EF of 25% grade 2 diastolic heart failure ? ?AKI (acute kidney injury) (HCC) ?Improve with IV diuresis and control of his blood pressure.   ?Continue current regimen, question cardiorenal syndrome. ? ?Uncontrolled type 2 diabetes mellitus with hyperglycemia, without long-term current use of insulin  ?A1c of 7.9, continue sliding scale, increase long-acting insulin. ? ?Morbid obesity: ?Has been counseled on lifestyle modification and diet. ? ?Dental cavity: ?He related he was taking oral Augmentin as an outpatient for 2 days due to a dental infection: Resume Augmentin for total of 7 days. ?He has an appointment with his dentist as an outpatient. ? ?DVT prophylaxis: lovenox ?Family Communication:none ?Status is: Inpatient ?Remains inpatient appropriate because: Acute decompensated heart  failure ? ? ? ?Code Status:  ? ?  ?Code Status Orders  ?(From admission, onward)  ?  ? ? ?  ? ?  Start     Ordered  ? 10/31/21 2042  Full code  Continuous       ? 10/31/21 2041  ? ?  ?  ? ?  ? ?Code Status History   ? ? This patient has a current code status but no historical code status.  ? ?  ? ? ? ? ?IV Access:  ? ?Peripheral IV ? ? ?Procedures and diagnostic studies:  ? ?DG Chest 2 View ? ?Result Date: 10/31/2021 ?CLINICAL DATA:  chest pain EXAM: CHEST - 2 VIEW COMPARISON:  July 05, 2018. FINDINGS: Mild interstitial opacities bilaterally. No visible pleural effusions or pneumothorax. Enlarged cardiac silhouette and pulmonary vascular congestion. IMPRESSION: 1. Mild interstitial opacities bilaterally, potentially mild interstitial pulmonary edema or the sequela of recurrent bouts of CHF. Atypical infection is thought less likely. 2. Cardiomegaly and pulmonary vascular congestion. Electronically Signed   By: Feliberto Harts M.D.   On: 10/31/2021 12:06  ? ?CT Angio Chest PE W and/or Wo Contrast ? ?Result Date: 10/31/2021 ?CLINICAL DATA:  Pulmonary embolism (PE) suspected, positive D-dimer; Abdominal pain, acute, nonlocalized EXAM: CT ANGIOGRAPHY CHEST CT ABDOMEN AND PELVIS WITH CONTRAST TECHNIQUE: Multidetector CT imaging of the chest was performed using the standard protocol during bolus administration of intravenous contrast. Multiplanar CT image reconstructions and MIPs were obtained to evaluate the vascular anatomy. Multidetector CT imaging of the abdomen and pelvis was performed using the standard protocol during bolus administration of intravenous contrast. RADIATION DOSE REDUCTION: This exam was performed according to the departmental dose-optimization program which includes automated exposure  control, adjustment of the mA and/or kV according to patient size and/or use of iterative reconstruction technique. CONTRAST:  OMNIPAQUE IOHEXOL 350 MG/ML SOLN COMPARISON:  CT abdomen pelvis 02/21/2016  FINDINGS: CTA CHEST FINDINGS Cardiovascular: Satisfactory opacification of the pulmonary mild cardiomegaly. No pericardial disease. The thoracic aorta is unremarkable. Normal heart size. No pericardial effusion. Mediastinum/Nodes: Prominent mediastinal and hilar lymph nodes, likely reactive. Lungs/Pleura: Mosaic attenuation of the lungs with mild interlobular septal thickening. There is a 5 mm right middle lobe pulmonary nodule (series 7, image 78). There is diffuse mild bronchial wall thickening. Small right pleural effusion. No pneumothorax. Musculoskeletal: No acute osseous abnormality. No suspicious lytic or blastic lesions. Review of the MIP images confirms the above findings. CT ABDOMEN and PELVIS FINDINGS Hepatobiliary: Hepatic steatosis. Nonspecific gallbladder wall thickening. The gallbladder is nondilated. Pancreas: Unremarkable. No pancreatic ductal dilatation or surrounding inflammatory changes. Spleen: Normal in size without focal abnormality. Adrenals/Urinary Tract: Adrenal glands are unremarkable. Multifocal renal cortical thinning/scarring in the lower poles. There are small subcentimeter bilateral renal cysts. There is no hydronephrosis or nephrolithiasis. The bladder is unremarkable. Stomach/Bowel: The stomach is within normal limits. There is mild stranding along the descending duodenum (sagittal image 65, axial image 42). There is no evidence of bowel obstruction.The appendix is normal. Vascular/Lymphatic: No significant vascular findings are present. No enlarged abdominal or pelvic lymph nodes. Reproductive: Unremarkable. Other: Prior midline incision. No abdominopelvic ascites. No free air. Musculoskeletal: No acute or significant osseous findings. Review of the MIP images confirms the above findings. IMPRESSION: Mosaic attenuation of the lungs with mild interlobular septal thickening could represent pulmonary edema and/or small airways disease. Small right pleural effusion. No evidence of  pulmonary embolism. Mild stranding along the descending duodenum, possibly representing focal duodenitis. Hepatic steatosis. Nonspecific gallbladder wall thickening but possibly related to hepatic disease or volume overload. Electronically Signed   By: Caprice Renshaw M.D.   On: 10/31/2021 14:47  ? ?CT ABDOMEN PELVIS W CONTRAST ? ?Result Date: 10/31/2021 ?CLINICAL DATA:  Pulmonary embolism (PE) suspected, positive D-dimer; Abdominal pain, acute, nonlocalized EXAM: CT ANGIOGRAPHY CHEST CT ABDOMEN AND PELVIS WITH CONTRAST TECHNIQUE: Multidetector CT imaging of the chest was performed using the standard protocol during bolus administration of intravenous contrast. Multiplanar CT image reconstructions and MIPs were obtained to evaluate the vascular anatomy. Multidetector CT imaging of the abdomen and pelvis was performed using the standard protocol during bolus administration of intravenous contrast. RADIATION DOSE REDUCTION: This exam was performed according to the departmental dose-optimization program which includes automated exposure control, adjustment of the mA and/or kV according to patient size and/or use of iterative reconstruction technique. CONTRAST:  OMNIPAQUE IOHEXOL 350 MG/ML SOLN COMPARISON:  CT abdomen pelvis 02/21/2016 FINDINGS: CTA CHEST FINDINGS Cardiovascular: Satisfactory opacification of the pulmonary mild cardiomegaly. No pericardial disease. The thoracic aorta is unremarkable. Normal heart size. No pericardial effusion. Mediastinum/Nodes: Prominent mediastinal and hilar lymph nodes, likely reactive. Lungs/Pleura: Mosaic attenuation of the lungs with mild interlobular septal thickening. There is a 5 mm right middle lobe pulmonary nodule (series 7, image 78). There is diffuse mild bronchial wall thickening. Small right pleural effusion. No pneumothorax. Musculoskeletal: No acute osseous abnormality. No suspicious lytic or blastic lesions. Review of the MIP images confirms the above findings. CT  ABDOMEN and PELVIS FINDINGS Hepatobiliary: Hepatic steatosis. Nonspecific gallbladder wall thickening. The gallbladder is nondilated. Pancreas: Unremarkable. No pancreatic ductal dilatation or surrounding inflammatory chan

## 2021-11-02 NOTE — Plan of Care (Signed)

## 2021-11-02 NOTE — Progress Notes (Addendum)
? ?Progress Note ? ?Patient Name: Nicholas Hicks ?Date of Encounter: 11/02/2021 ? ?[facility] Cardiologist: Parke Poisson, MD  ? ?Subjective  ? ?Denies further chest discomfort. No significant SOB. Chest pain has not recurred since he was first admitted.  ? ?Inpatient Medications  ?  ?Scheduled Meds: ? amoxicillin-clavulanate  1 tablet Oral Q12H  ? aspirin EC  81 mg Oral Daily  ? carvedilol  12.5 mg Oral BID WC  ? empagliflozin  10 mg Oral Daily  ? enoxaparin (LOVENOX) injection  40 mg Subcutaneous Q24H  ? furosemide  40 mg Intravenous BID  ? insulin aspart  0-5 Units Subcutaneous QHS  ? insulin aspart  0-9 Units Subcutaneous TID WC  ? insulin aspart  3 Units Subcutaneous TID WC  ? insulin detemir  5 Units Subcutaneous BID  ? sacubitril-valsartan  1 tablet Oral BID  ? sodium chloride flush  3 mL Intravenous Q12H  ? spironolactone  25 mg Oral Daily  ? ?Continuous Infusions: ? sodium chloride    ? ?PRN Meds: ?sodium chloride, acetaminophen, methocarbamol, nitroGLYCERIN, ondansetron (ZOFRAN) IV, sodium chloride flush  ? ?Vital Signs  ?  ?Vitals:  ? 11/01/21 1344 11/01/21 2044 11/02/21 0500 11/02/21 9326  ?BP: (!) 142/92 (!) 125/94  (!) 137/99  ?Pulse: 81 99  89  ?Resp: 20 16  17   ?Temp: 98.1 ?F (36.7 ?C) 97.6 ?F (36.4 ?C)  98.3 ?F (36.8 ?C)  ?TempSrc: Oral Oral  Oral  ?SpO2: 98% 96%  100%  ?Weight:   116.7 kg   ?Height:      ? ? ?Intake/Output Summary (Last 24 hours) at 11/02/2021 0939 ?Last data filed at 11/02/2021 0820 ?Gross per 24 hour  ?Intake 363 ml  ?Output 3775 ml  ?Net -3412 ml  ? ? ?  11/02/2021  ?  5:00 AM 11/01/2021  ?  4:55 AM 10/31/2021  ?  8:42 PM  ?Last 3 Weights  ?Weight (lbs) 257 lb 4.4 oz 257 lb 8 oz 257 lb 8 oz  ?Weight (kg) 116.7 kg 116.8 kg 116.8 kg  ?   ? ?Telemetry  ?  ?NSR without significant ventricular ectopy - Personally Reviewed ? ?ECG  ?  ?NSR without significant ST-T wave changes - Personally Reviewed ? ?Physical Exam  ? ?GEN: No acute distress.   ?Neck: No JVD ?Cardiac: RRR, no  murmurs, rubs, or gallops.  ?Respiratory: Clear to auscultation bilaterally. ?GI: Soft, nontender, non-distended  ?MS: No edema; No deformity. ?Neuro:  Nonfocal  ?Psych: Normal affect  ? ?Labs  ?  ?High Sensitivity Troponin:   ?Recent Labs  ?Lab 10/31/21 ?1220 10/31/21 ?1351 10/31/21 ?2104 10/31/21 ?2218  ?TROPONINIHS 34* 37* 34* 40*  ?   ?Chemistry ?Recent Labs  ?Lab 10/31/21 ?1220 10/31/21 ?2104 11/01/21 ?0430 11/02/21 ?11/04/21  ?NA 137  --  137 136  ?K 4.3  --  4.1 4.1  ?CL 106  --  106 103  ?CO2 24  --  23 26  ?GLUCOSE 212*  --  174* 213*  ?BUN 23*  --  21* 23*  ?CREATININE 1.35* 1.32* 1.17 1.19  ?CALCIUM 9.0  --  8.9 8.9  ?MG  --  1.9  --   --   ?PROT 7.2  --   --   --   ?ALBUMIN 3.5  --   --   --   ?AST 71*  --   --   --   ?ALT 111*  --   --   --   ?ALKPHOS 66  --   --   --   ?  BILITOT 0.9  --   --   --   ?GFRNONAA >60 >60 >60 >60  ?ANIONGAP 7  --  8 7  ?  ?Lipids No results for input(s): CHOL, TRIG, HDL, LABVLDL, LDLCALC, CHOLHDL in the last 168 hours.  ?Hematology ?Recent Labs  ?Lab 10/31/21 ?1220 10/31/21 ?2104  ?WBC 6.7 6.2  ?RBC 5.16 5.00  ?HGB 14.8 14.5  ?HCT 47.0 45.5  ?MCV 91.1 91.0  ?MCH 28.7 29.0  ?MCHC 31.5 31.9  ?RDW 13.4 13.4  ?PLT 308 284  ? ?Thyroid No results for input(s): TSH, FREET4 in the last 168 hours.  ?BNP ?Recent Labs  ?Lab 10/31/21 ?1220  ?BNP 777.6*  ?  ?DDimer  ?Recent Labs  ?Lab 10/31/21 ?1220  ?DDIMER 1.38*  ?  ? ?Radiology  ?  ?DG Chest 2 View ? ?Result Date: 10/31/2021 ?CLINICAL DATA:  chest pain EXAM: CHEST - 2 VIEW COMPARISON:  July 05, 2018. FINDINGS: Mild interstitial opacities bilaterally. No visible pleural effusions or pneumothorax. Enlarged cardiac silhouette and pulmonary vascular congestion. IMPRESSION: 1. Mild interstitial opacities bilaterally, potentially mild interstitial pulmonary edema or the sequela of recurrent bouts of CHF. Atypical infection is thought less likely. 2. Cardiomegaly and pulmonary vascular congestion. Electronically Signed   By: Feliberto Harts  M.D.   On: 10/31/2021 12:06  ? ?CT Angio Chest PE W and/or Wo Contrast ? ?Result Date: 10/31/2021 ?CLINICAL DATA:  Pulmonary embolism (PE) suspected, positive D-dimer; Abdominal pain, acute, nonlocalized EXAM: CT ANGIOGRAPHY CHEST CT ABDOMEN AND PELVIS WITH CONTRAST TECHNIQUE: Multidetector CT imaging of the chest was performed using the standard protocol during bolus administration of intravenous contrast. Multiplanar CT image reconstructions and MIPs were obtained to evaluate the vascular anatomy. Multidetector CT imaging of the abdomen and pelvis was performed using the standard protocol during bolus administration of intravenous contrast. RADIATION DOSE REDUCTION: This exam was performed according to the departmental dose-optimization program which includes automated exposure control, adjustment of the mA and/or kV according to patient size and/or use of iterative reconstruction technique. CONTRAST:  OMNIPAQUE IOHEXOL 350 MG/ML SOLN COMPARISON:  CT abdomen pelvis 02/21/2016 FINDINGS: CTA CHEST FINDINGS Cardiovascular: Satisfactory opacification of the pulmonary mild cardiomegaly. No pericardial disease. The thoracic aorta is unremarkable. Normal heart size. No pericardial effusion. Mediastinum/Nodes: Prominent mediastinal and hilar lymph nodes, likely reactive. Lungs/Pleura: Mosaic attenuation of the lungs with mild interlobular septal thickening. There is a 5 mm right middle lobe pulmonary nodule (series 7, image 78). There is diffuse mild bronchial wall thickening. Small right pleural effusion. No pneumothorax. Musculoskeletal: No acute osseous abnormality. No suspicious lytic or blastic lesions. Review of the MIP images confirms the above findings. CT ABDOMEN and PELVIS FINDINGS Hepatobiliary: Hepatic steatosis. Nonspecific gallbladder wall thickening. The gallbladder is nondilated. Pancreas: Unremarkable. No pancreatic ductal dilatation or surrounding inflammatory changes. Spleen: Normal in size without  focal abnormality. Adrenals/Urinary Tract: Adrenal glands are unremarkable. Multifocal renal cortical thinning/scarring in the lower poles. There are small subcentimeter bilateral renal cysts. There is no hydronephrosis or nephrolithiasis. The bladder is unremarkable. Stomach/Bowel: The stomach is within normal limits. There is mild stranding along the descending duodenum (sagittal image 65, axial image 42). There is no evidence of bowel obstruction.The appendix is normal. Vascular/Lymphatic: No significant vascular findings are present. No enlarged abdominal or pelvic lymph nodes. Reproductive: Unremarkable. Other: Prior midline incision. No abdominopelvic ascites. No free air. Musculoskeletal: No acute or significant osseous findings. Review of the MIP images confirms the above findings. IMPRESSION: Mosaic attenuation of the lungs with mild  interlobular septal thickening could represent pulmonary edema and/or small airways disease. Small right pleural effusion. No evidence of pulmonary embolism. Mild stranding along the descending duodenum, possibly representing focal duodenitis. Hepatic steatosis. Nonspecific gallbladder wall thickening but possibly related to hepatic disease or volume overload. Electronically Signed   By: Caprice RenshawJacob  Kahn M.D.   On: 10/31/2021 14:47  ? ?CT ABDOMEN PELVIS W CONTRAST ? ?Result Date: 10/31/2021 ?CLINICAL DATA:  Pulmonary embolism (PE) suspected, positive D-dimer; Abdominal pain, acute, nonlocalized EXAM: CT ANGIOGRAPHY CHEST CT ABDOMEN AND PELVIS WITH CONTRAST TECHNIQUE: Multidetector CT imaging of the chest was performed using the standard protocol during bolus administration of intravenous contrast. Multiplanar CT image reconstructions and MIPs were obtained to evaluate the vascular anatomy. Multidetector CT imaging of the abdomen and pelvis was performed using the standard protocol during bolus administration of intravenous contrast. RADIATION DOSE REDUCTION: This exam was performed  according to the departmental dose-optimization program which includes automated exposure control, adjustment of the mA and/or kV according to patient size and/or use of iterative reconstruction technique. C

## 2021-11-02 NOTE — Progress Notes (Signed)
Upon checking patient's vital signs, pt's O2 sats on room air 87-88%. Pt. Denied SOB. Respirations were WDL and breathing pattern even/unlabored. Pt. Placed on 2L [place] for comfort and O2 sats stabled at 94%. ?

## 2021-11-03 ENCOUNTER — Other Ambulatory Visit (HOSPITAL_COMMUNITY): Payer: Self-pay

## 2021-11-03 LAB — BASIC METABOLIC PANEL
Anion gap: 9 (ref 5–15)
BUN: 23 mg/dL — ABNORMAL HIGH (ref 6–20)
CO2: 32 mmol/L (ref 22–32)
Calcium: 9.5 mg/dL (ref 8.9–10.3)
Chloride: 97 mmol/L — ABNORMAL LOW (ref 98–111)
Creatinine, Ser: 1.49 mg/dL — ABNORMAL HIGH (ref 0.61–1.24)
GFR, Estimated: 58 mL/min — ABNORMAL LOW (ref >60)
Glucose, Bld: 196 mg/dL — ABNORMAL HIGH (ref 70–99)
Potassium: 4 mmol/L (ref 3.5–5.1)
Sodium: 138 mmol/L (ref 135–145)

## 2021-11-03 LAB — GLUCOSE, CAPILLARY: Glucose-Capillary: 227 mg/dL — ABNORMAL HIGH (ref 70–99)

## 2021-11-03 MED ORDER — GLIPIZIDE 5 MG PO TABS: 2.5000 mg | ORAL_TABLET | Freq: Every day | ORAL | 3 refills | Status: AC

## 2021-11-03 MED ORDER — EMPAGLIFLOZIN 10 MG PO TABS
10.0000 mg | ORAL_TABLET | Freq: Every day | ORAL | 3 refills | Status: AC
Start: 2021-11-03 — End: ?

## 2021-11-03 MED ORDER — ASPIRIN 81 MG PO TBEC: 81.0000 mg | DELAYED_RELEASE_TABLET | Freq: Every day | ORAL | 11 refills | Status: AC

## 2021-11-03 MED ORDER — SACUBITRIL-VALSARTAN 97-103 MG PO TABS: 1.0000 | ORAL_TABLET | Freq: Two times a day (BID) | ORAL | 3 refills | Status: AC

## 2021-11-03 MED ORDER — METFORMIN HCL 500 MG PO TABS
1000.0000 mg | ORAL_TABLET | Freq: Two times a day (BID) | ORAL | Status: DC
  Filled 2021-11-03: qty 2

## 2021-11-03 MED ORDER — FUROSEMIDE 40 MG PO TABS: 40.0000 mg | ORAL_TABLET | Freq: Two times a day (BID) | ORAL | 3 refills | Status: AC

## 2021-11-03 MED ORDER — BLOOD GLUCOSE METER KIT: PACK | 0 refills | Status: AC

## 2021-11-03 MED ORDER — CARVEDILOL 12.5 MG PO TABS
12.5000 mg | ORAL_TABLET | Freq: Two times a day (BID) | ORAL | 3 refills | Status: AC
Start: 2021-11-03 — End: ?

## 2021-11-03 MED ORDER — GLIPIZIDE 5 MG PO TABS
2.5000 mg | ORAL_TABLET | Freq: Every day | ORAL | Status: DC
  Filled 2021-11-03: qty 1

## 2021-11-03 MED ORDER — METFORMIN HCL 1000 MG PO TABS: 500.0000 mg | ORAL_TABLET | Freq: Two times a day (BID) | ORAL | 3 refills | Status: AC

## 2021-11-03 MED ORDER — SPIRONOLACTONE 25 MG PO TABS
25.0000 mg | ORAL_TABLET | Freq: Every day | ORAL | 3 refills | Status: AC
Start: 2021-11-03 — End: ?

## 2021-11-03 NOTE — Discharge Summary (Signed)
Physician Discharge Summary  ?[name] XBL:[id number] DOB: Apr 01, 1976 DOA: 10/31/2021 ? ?PCP: Patient, No Pcp Per (Inactive) ? ?Admit date: 10/31/2021 ?Discharge date: 11/03/2021 ? ?Admitted From: Home ?Disposition:  Home ? ?Recommendations for Outpatient Follow-up:  ?Follow up with cards in 1-2 weeks ?Please obtain BMP/CBC in one week ?Ischemic work-up as an outpatient ? ?Home Health:no ?Equipment/Devices:none ? ?Discharge Condition:Stable ?CODE STATUS:Full ?Diet recommendation: Heart Healthy ? ?Brief/Interim Summary: ?46 y.o. male past medical history significant for diabetes mellitus, essential hypertension major depressive disorder who has not seen a PCP in over 2 years came in with shortness of breath was found to be in pulmonary edema probably due to acute decompensated heart failure ? ?Discharge Diagnoses:  ?Principal Problem: ?  Acute hypoxemic respiratory failure ([place]) ?Active Problems: ?  Acute clinical systolic heart failure ([place]) ?  AKI (acute kidney injury) ([place]) ?  Uncontrolled type 2 diabetes mellitus with hyperglycemia, without long-term current use of insulin ([place]) ? ?Acute systolic decompensated heart failure: ?Acute respiratory failure has been ruled out. ?He was started on IV Lasix Coreg and Entresto he diuresed about 4 L. ?2D echo was done that showed an EF of 25% global hypokinesia. ?He admits to alcohol and cocaine use. ?He was counseled about this. ?He will go home on Coreg, Lasix, Entresto, Aldactone and Jardiance. ? ?Hypertensive urgency: ?Setting of cocaine use he was started on Coreg Lasix Entresto and spironolactone his blood pressure improved we will continue these medications and outpatient follow-up with cardiology. ?We will need an ischemic work-up as an outpatient. ? ?Elevated troponins: ?Setting of acute decompensated heart failure likely demand ischemia. ?2D echo showed an EF of 25% with global hypokinesia cardiology was consulted. ?They recommended follow-up with them for an  ischemic evaluation as an outpatient. ? ?Uncontrolled diabetes mellitus type 2 with hyperglycemia: ?With an A1c of 7.9, he required insulin long-acting and sliding scale he was started on metformin and glipizide as well as Jardiance which she will continue as an outpatient. ? ?Morbid obesity: ?He has been counseled continue lifestyle modification and diet restriction with modifications. ? ?Dental cavity: ?He completed his course of antibiotics in house of [facility]. ? ?Discharge Instructions ? ?Discharge Instructions   ? ? Diet - low sodium heart healthy   Complete by: As directed ?  ? Increase activity slowly   Complete by: As directed ?  ? ?  ? ?Allergies as of 11/03/2021   ? ?   Reactions  ? Peanut-containing Drug Products Anaphylaxis  ? ?  ? ?  ?Medication List  ?  ? ?STOP taking these medications   ? ?methocarbamol 500 MG tablet ?Commonly known as: ROBAXIN ?  ? ?  ? ?TAKE these medications   ? ?aspirin 81 MG EC tablet ?Take 1 tablet (81 mg total) by mouth daily. Swallow whole. ?  ?blood glucose meter kit and supplies ?Dispense based on patient and insurance preference. Use up to four times daily as directed. (FOR ICD-10 E10.9, E11.9). ?  ?carvedilol 12.5 MG tablet ?Commonly known as: COREG ?Take 1 tablet (12.5 mg total) by mouth 2 (two) times daily with a meal. ?  ?empagliflozin 10 MG Tabs tablet ?Commonly known as: JARDIANCE ?Take 1 tablet (10 mg total) by mouth daily. ?  ?furosemide 40 MG tablet ?Commonly known as: Lasix ?Take 1 tablet (40 mg total) by mouth 2 (two) times daily. ?  ?glipiZIDE 5 MG tablet ?Commonly known as: GLUCOTROL ?Take 0.5 tablets (2.5 mg total) by mouth daily before breakfast. ?  ?metFORMIN 1000  MG tablet ?Commonly known as: GLUCOPHAGE ?Take 0.5 tablets (500 mg total) by mouth 2 (two) times daily with a meal. ?  ?sacubitril-valsartan 97-103 MG ?Commonly known as: ENTRESTO ?Take 1 tablet by mouth 2 (two) times daily. ?  ?spironolactone 25 MG tablet ?Commonly known as: ALDACTONE ?Take 1  tablet (25 mg total) by mouth daily. ?  ? ?  ? ? Follow-up Information   ? ? [facility] Follow up.   ?Why: PCP appt scheduled for November 19, 2021 at 2:30 pm. ?Contact information: ?[address] ?[place] [address] ?[phone] ? ?  ?  ? ? Lendon Colonel, NP Follow up on 11/16/2021.   ?Specialties: Nurse Practitioner, Radiology, Cardiology ?Why: '@8' :25AM. Cardiology follow up ?Contact information: ?[address] ?[address] ?[place] [address] ?[phone] ? ? ?  ?  ? ?  ?  ? ?  ? ?Allergies  ?Allergen Reactions  ? Peanut-Containing Drug Products Anaphylaxis  ? ? ?Consultations: ?Cardiology ? ? ?Procedures/Studies: ?DG Chest 2 View ? ?Result Date: 10/31/2021 ?CLINICAL DATA:  chest pain EXAM: CHEST - 2 VIEW COMPARISON:  July 05, 2018. FINDINGS: Mild interstitial opacities bilaterally. No visible pleural effusions or pneumothorax. Enlarged cardiac silhouette and pulmonary vascular congestion. IMPRESSION: 1. Mild interstitial opacities bilaterally, potentially mild interstitial pulmonary edema or the sequela of recurrent bouts of CHF. Atypical infection is thought less likely. 2. Cardiomegaly and pulmonary vascular congestion. Electronically Signed   By: Margaretha Sheffield M.D.   On: 10/31/2021 12:06  ? ?CT Angio Chest PE W and/or Wo Contrast ? ?Result Date: 10/31/2021 ?CLINICAL DATA:  Pulmonary embolism (PE) suspected, positive D-dimer; Abdominal pain, acute, nonlocalized EXAM: CT ANGIOGRAPHY CHEST CT ABDOMEN AND PELVIS WITH CONTRAST TECHNIQUE: Multidetector CT imaging of the chest was performed using the standard protocol during bolus administration of intravenous contrast. Multiplanar CT image reconstructions and MIPs were obtained to evaluate the vascular anatomy. Multidetector CT imaging of the abdomen and pelvis was performed using the standard protocol during bolus administration of intravenous contrast. RADIATION DOSE REDUCTION: This exam  was performed according to the departmental dose-optimization program which includes automated exposure control, adjustment of the mA and/or kV according to patient size and/or use of iterative reconstruction technique. CONTRAST:  143m OMNIPAQUE IOHEXOL 350 MG/ML SOLN COMPARISON:  CT abdomen pelvis 02/21/2016 FINDINGS: CTA CHEST FINDINGS Cardiovascular: Satisfactory opacification of the pulmonary mild cardiomegaly. No pericardial disease. The thoracic aorta is unremarkable. Normal heart size. No pericardial effusion. Mediastinum/Nodes: Prominent mediastinal and hilar lymph nodes, likely reactive. Lungs/Pleura: Mosaic attenuation of the lungs with mild interlobular septal thickening. There is a 5 mm right middle lobe pulmonary nodule (series 7, image 78). There is diffuse mild bronchial wall thickening. Small right pleural effusion. No pneumothorax. Musculoskeletal: No acute osseous abnormality. No suspicious lytic or blastic lesions. Review of the MIP images confirms the above findings. CT ABDOMEN and PELVIS FINDINGS Hepatobiliary: Hepatic steatosis. Nonspecific gallbladder wall thickening. The gallbladder is nondilated. Pancreas: Unremarkable. No pancreatic ductal dilatation or surrounding inflammatory changes. Spleen: Normal in size without focal abnormality. Adrenals/Urinary Tract: Adrenal glands are unremarkable. Multifocal renal cortical thinning/scarring in the lower poles. There are small subcentimeter bilateral renal cysts. There is no hydronephrosis or nephrolithiasis. The bladder is unremarkable. Stomach/Bowel: The stomach is within normal limits. There is mild stranding along the descending duodenum (sagittal image 65, axial image 42). There is no evidence of bowel obstruction.The appendix is normal. Vascular/Lymphatic: No significant vascular findings are present. No enlarged abdominal or  pelvic lymph nodes. Reproductive: Unremarkable. Other: Prior midline incision. No abdominopelvic ascites. No free  air. Musculoskeletal: No acute or significant osseous findings. Review of the MIP images confirms the above findings. IMPRESSION: Mosaic attenuation of the lungs with mild interlobular septal thickening could r

## 2021-11-03 NOTE — TOC Transition Note (Signed)
Transition of Care (TOC) - CM/SW Discharge Note ? ? ?Patient Details  ?Name: Skye Lewter ?MRN: [id number] ?Date of Birth: 10/30/1975 ? ?Transition of Care (TOC) CM/SW Contact:  ?Darleene Cleaver, LCSW ?Phone Number: ?11/03/2021, 10:38 AM ? ? ?Clinical Narrative:    ? ?CSW received consult that patient needed assistance for medications.  Patient's medications are on the $4 [employer] list, coupon was provided for [facility], and also patient was set up with the [facility], their pharmacy during the week can assist with the cost of Jardiance.  CSW signing off please reconsult if any other social work needs arise. ? ?Final next level of care: Home/Self Care ?Barriers to Discharge: Barriers Resolved ? ? ?Patient Goals and CMS Choice ?Patient states their goals for this hospitalization and ongoing recovery are:: To return back home. ?  ?  ? ?Discharge Placement ?  ?           ?  ?  ?  ?  ? ?Discharge Plan and Services ?  ?  ?           ?  ?  ?  ?  ?  ?  ?  ?  ?  ?  ? ?Social Determinants of Health (SDOH) Interventions ?  ? ? ?Readmission Risk Interventions ?   ? View : No data to display.  ?  ?  ?  ? ? ? ? ? ?

## 2021-11-03 NOTE — Progress Notes (Signed)
RN walks into patient's room and a strong scent of cigarette smoke and perfume was smelt. Patient was informed about smoking policy as he was on oxygen. As RN walked down the hall , the scent became stronger and  Charge Nurse was notified as well as the [facility]. Security was called and patient including a visitor he had in the  room were again  taken through hospital policy in regards to smoking. At this point , the patient's visitor was asked to go home for the shift. As a result of this , patient became upset  after his visitor was asked to leave. At  this time, patient is threatening of leaving AMA. On call provider was paged. I will continue to offer emotional support . ?

## 2021-11-03 NOTE — Progress Notes (Signed)
Pt discharged in stable condition with all patient belongings. All instructions reviewed with patient and all questions answered. IV removed. Pt was eager to leave and requested to ambulate independently to the hospital exit while patient visitor waited for medication coupon.  ?
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
## 2021-11-16 ENCOUNTER — Ambulatory Visit: Payer: Self-pay | Admitting: Adult Health

## 2021-11-19 ENCOUNTER — Inpatient Hospital Stay (INDEPENDENT_AMBULATORY_CARE_PROVIDER_SITE_OTHER): Payer: Self-pay | Admitting: Primary Care

## 2021-11-20 ENCOUNTER — Encounter: Payer: Self-pay | Admitting: Adult Health
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
## 2021-12-28 ENCOUNTER — Ambulatory Visit: Payer: Self-pay | Admitting: Adult Health

## 2022-01-01 ENCOUNTER — Encounter: Payer: Self-pay | Admitting: Adult Health

## 2022-01-17 ENCOUNTER — Emergency Department (HOSPITAL_COMMUNITY): Payer: Self-pay

## 2022-01-17 ENCOUNTER — Other Ambulatory Visit: Payer: Self-pay

## 2022-01-17 ENCOUNTER — Encounter (HOSPITAL_COMMUNITY): Payer: Self-pay | Admitting: Emergency Medicine

## 2022-01-17 ENCOUNTER — Emergency Department (HOSPITAL_COMMUNITY)
Admission: EM | Admit: 2022-01-17 | Discharge: 2022-01-17 | Disposition: A | Discharge status: Home / Self Care | Payer: Self-pay | Attending: Emergency Medicine | Admitting: Emergency Medicine

## 2022-01-17 DIAGNOSIS — R1110 Vomiting, unspecified: Secondary | ICD-10-CM | POA: Insufficient documentation

## 2022-01-17 DIAGNOSIS — Z5321 Procedure and treatment not carried out due to patient leaving prior to being seen by health care provider: Secondary | ICD-10-CM | POA: Insufficient documentation

## 2022-01-17 DIAGNOSIS — R109 Unspecified abdominal pain: Secondary | ICD-10-CM | POA: Insufficient documentation

## 2022-01-17 DIAGNOSIS — R61 Generalized hyperhidrosis: Secondary | ICD-10-CM | POA: Insufficient documentation

## 2022-01-17 DIAGNOSIS — R0602 Shortness of breath: Secondary | ICD-10-CM | POA: Insufficient documentation

## 2022-01-17 LAB — CBC WITH DIFFERENTIAL/PLATELET
Abs Immature Granulocytes: 0.01 10*3/uL (ref 0.00–0.07)
Basophils Absolute: 0.1 10*3/uL (ref 0.0–0.1)
Basophils Relative: 1 %
Eosinophils Absolute: 0.1 10*3/uL (ref 0.0–0.5)
Eosinophils Relative: 1 %
HCT: 53.6 % — ABNORMAL HIGH (ref 39.0–52.0)
Hemoglobin: 16.9 g/dL (ref 13.0–17.0)
Immature Granulocytes: 0 %
Lymphocytes Relative: 32 %
Lymphs Abs: 2.2 10*3/uL (ref 0.7–4.0)
MCH: 28.2 pg (ref 26.0–34.0)
MCHC: 31.5 g/dL (ref 30.0–36.0)
MCV: 89.5 fL (ref 80.0–100.0)
Monocytes Absolute: 0.6 10*3/uL (ref 0.1–1.0)
Monocytes Relative: 8 %
Neutro Abs: 4 10*3/uL (ref 1.7–7.7)
Neutrophils Relative %: 58 %
Platelets: 303 10*3/uL (ref 150–400)
RBC: 5.99 MIL/uL — ABNORMAL HIGH (ref 4.22–5.81)
RDW: 14.6 % (ref 11.5–15.5)
WBC: 6.9 10*3/uL (ref 4.0–10.5)
nRBC: 0 % (ref 0.0–0.2)

## 2022-01-17 LAB — COMPREHENSIVE METABOLIC PANEL
ALT: 30 U/L (ref 0–44)
AST: 35 U/L (ref 15–41)
Albumin: 3.5 g/dL (ref 3.5–5.0)
Alkaline Phosphatase: 64 U/L (ref 38–126)
Anion gap: 13 (ref 5–15)
BUN: 18 mg/dL (ref 6–20)
CO2: 22 mmol/L (ref 22–32)
Calcium: 9 mg/dL (ref 8.9–10.3)
Chloride: 104 mmol/L (ref 98–111)
Creatinine, Ser: 1.37 mg/dL — ABNORMAL HIGH (ref 0.61–1.24)
GFR, Estimated: >60 mL/min (ref >60)
Glucose, Bld: 234 mg/dL — ABNORMAL HIGH (ref 70–99)
Potassium: 4.5 mmol/L (ref 3.5–5.1)
Sodium: 139 mmol/L (ref 135–145)
Total Bilirubin: 0.8 mg/dL (ref 0.3–1.2)
Total Protein: 6.7 g/dL (ref 6.5–8.1)

## 2022-01-17 LAB — URINALYSIS, ROUTINE W REFLEX MICROSCOPIC
Bacteria, UA: NONE SEEN
Bilirubin Urine: NEGATIVE
Glucose, UA: 150 mg/dL — AB
Ketones, ur: NEGATIVE mg/dL
Nitrite: NEGATIVE
Protein, ur: 100 mg/dL — AB
Specific Gravity, Urine: 1.019 (ref 1.005–1.030)
pH: 5 (ref 5.0–8.0)

## 2022-01-17 LAB — TROPONIN I (HIGH SENSITIVITY): Troponin I (High Sensitivity): 25 ng/L — ABNORMAL HIGH (ref <18)

## 2022-01-17 LAB — I-STAT CHEM 8, ED
BUN: 24 mg/dL — ABNORMAL HIGH (ref 6–20)
Calcium, Ion: 1.03 mmol/L — ABNORMAL LOW (ref 1.15–1.40)
Chloride: 105 mmol/L (ref 98–111)
Creatinine, Ser: 1.3 mg/dL — ABNORMAL HIGH (ref 0.61–1.24)
Glucose, Bld: 229 mg/dL — ABNORMAL HIGH (ref 70–99)
HCT: 55 % — ABNORMAL HIGH (ref 39.0–52.0)
Hemoglobin: 18.7 g/dL — ABNORMAL HIGH (ref 13.0–17.0)
Potassium: 4.7 mmol/L (ref 3.5–5.1)
Sodium: 141 mmol/L (ref 135–145)
TCO2: 25 mmol/L (ref 22–32)

## 2022-01-17 LAB — LIPASE, BLOOD: Lipase: 55 U/L — ABNORMAL HIGH (ref 11–51)

## 2022-01-17 MED ORDER — ONDANSETRON 4 MG PO TBDP
8.0000 mg | ORAL_TABLET | Freq: Once | ORAL | Status: AC
  Administered 2022-01-17: 8 mg via ORAL
  Filled 2022-01-17: qty 2

## 2022-01-17 NOTE — ED Provider Triage Note (Signed)
Emergency Medicine Provider Triage Evaluation Note  Nicholas Hicks , a 46 y.o. male  was evaluated in triage.  Pt complains of vomiting, sweating, diffuse abdominal pain, SHOB onset after eating breakfast this morning.  Review of Systems  Positive: SHOB, abdominal pain, vomiting, diaphoresis  Negative: fever  Physical Exam  BP (!) 153/106 (BP Location: Right Arm)   Pulse (!) 112   Temp 97.8 F (36.6 C) (Oral)   Resp (!) 22   SpO2 100%  Gen:   Awake, appears uncomfortable   Resp:  Normal effort  MSK:   Moves extremities without difficulty  Other:  Diaphoretic   Medical Decision Making  Medically screening exam initiated at 10:01 AM.  Appropriate orders placed.  Keron Dupee was informed that the remainder of the evaluation will be completed by another provider, this initial triage assessment does not replace that evaluation, and the importance of remaining in the ED until their evaluation is complete.     Jeannie Fend, PA-C 01/17/22 1003

## 2022-01-17 NOTE — ED Triage Notes (Signed)
Pt states he woke up with generalized abd pain. Pt began vomiting and sweating. Denies diarrhea.

## 2022-01-17 NOTE — ED Notes (Signed)
Pt is not responding when [facility] calls

## 2022-10-19 IMAGING — CT CT ABD-PELV W/ CM
3 of 11 series · 12 of 46 positions shown, 17 images · IV contrast (agent unspecified)
Comparison: CT abdomen pelvis 02/21/2016

CLINICAL DATA: Pulmonary embolism (PE) suspected, positive D-dimer;
Abdominal pain, acute, nonlocalized



[Series 6: thins · axial · 0.89mm/px · z∈[-457,-169]mm · 8 of 334 slices shown]
[im 23/334  soft-tissue]
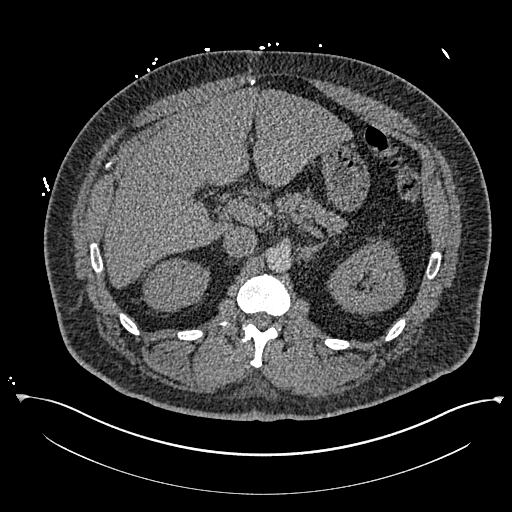
[im 67/334  soft-tissue]
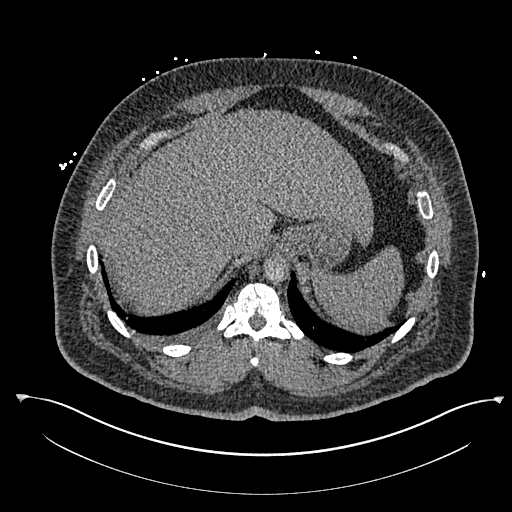
[im 112/334  soft-tissue]
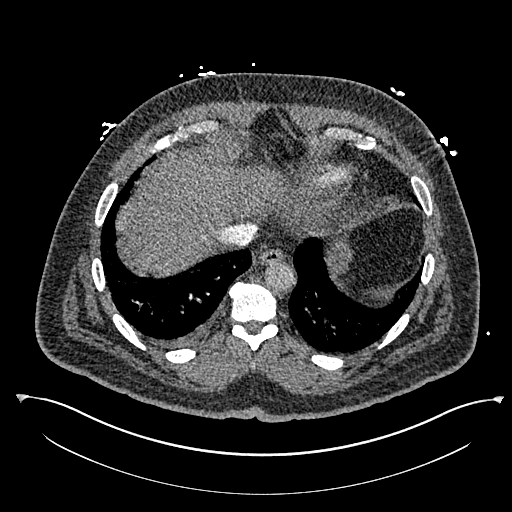
[im 156/334  soft-tissue]
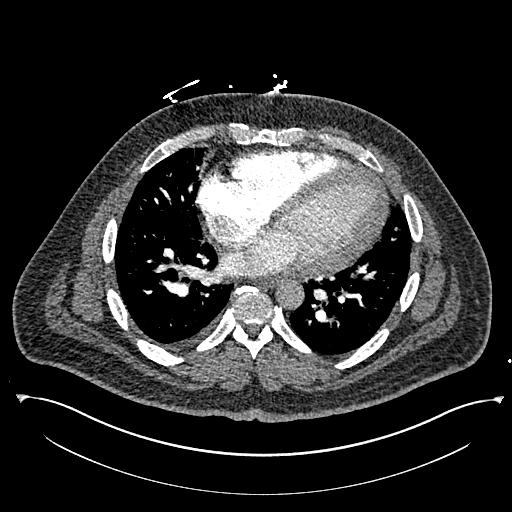
[im 178/334  soft-tissue]
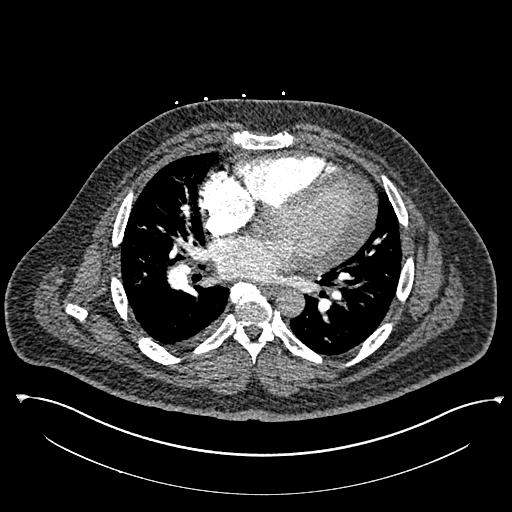
[im 223/334  soft-tissue]
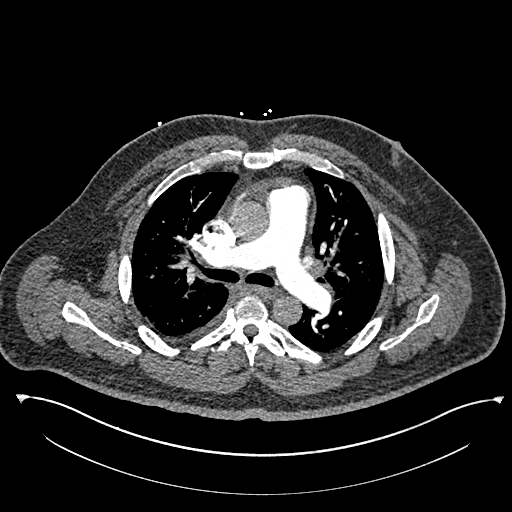
[im 267/334  soft-tissue]
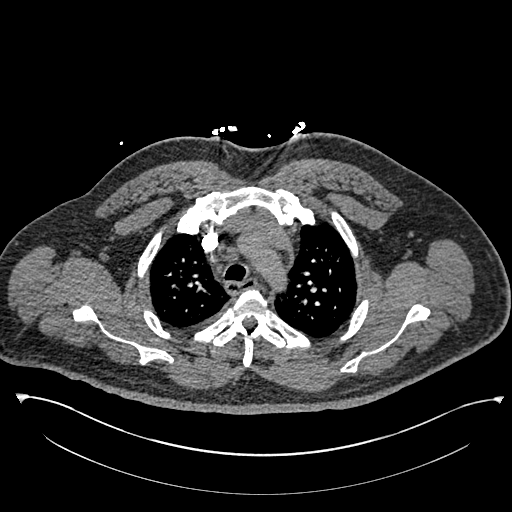
[im 311/334  soft-tissue]
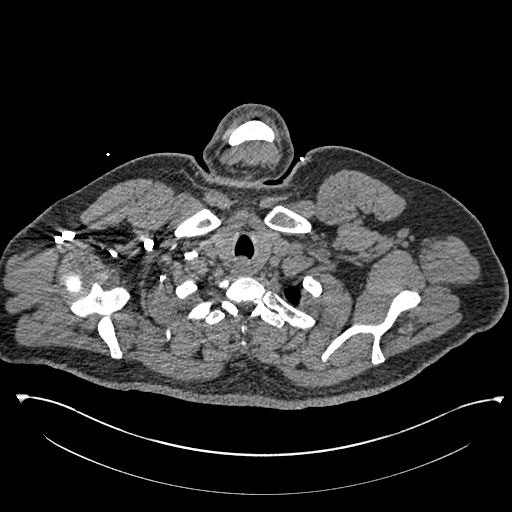

[Series 8: coronal mpr · coronal · 0.72mm/px · 1 of 176 slices shown, 2 images]
[im 88/176  soft-tissue]
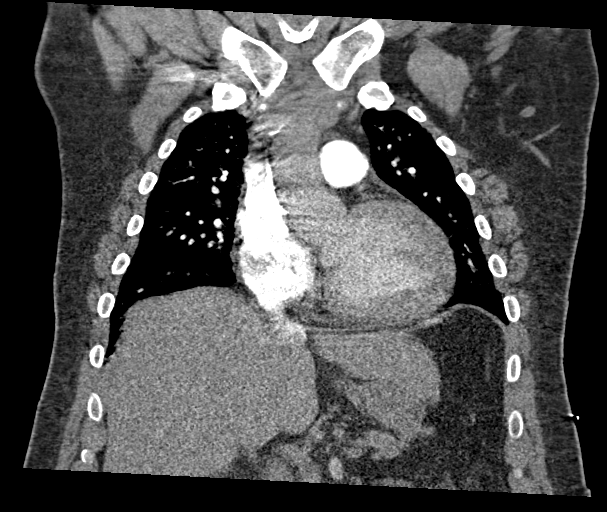
[im 88/176  bone]
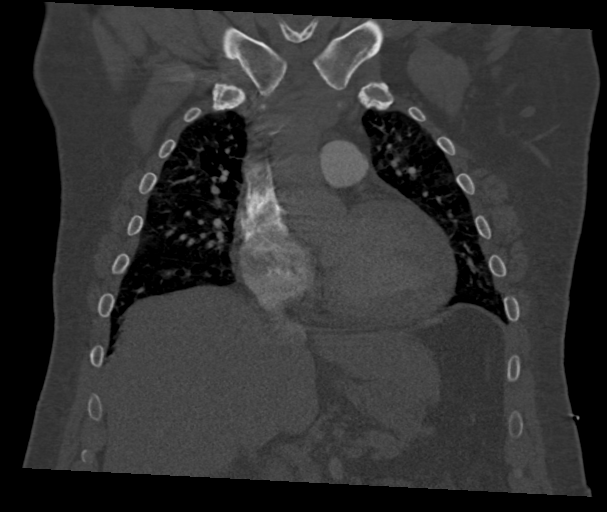

[Series 12: axial st · axial · 0.98mm/px · z∈[-704,-444]mm · 3 of 104 slices shown, 7 images]
[im 26/104  soft-tissue]
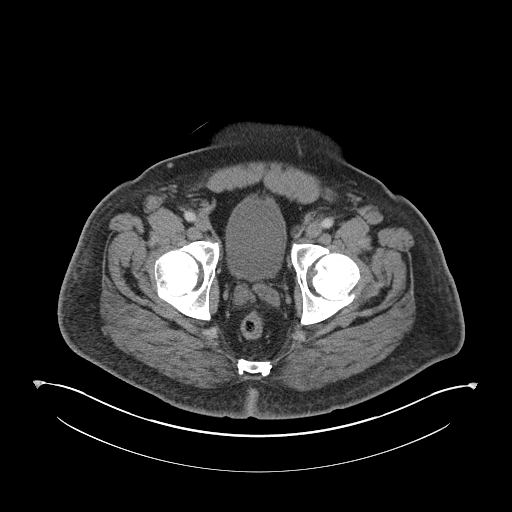
[im 26/104  lung]
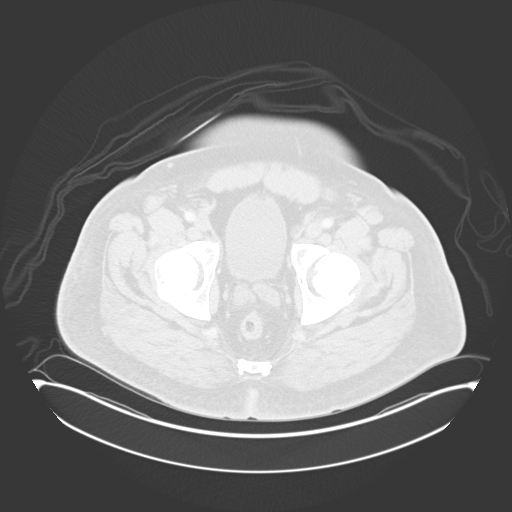
[im 26/104  bone]
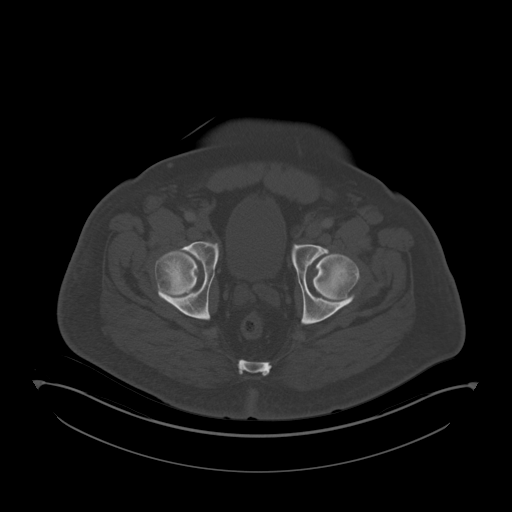
[im 52/104  soft-tissue]
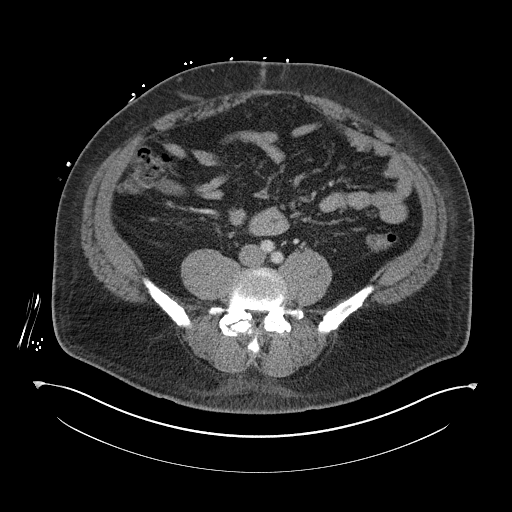
[im 52/104  lung]
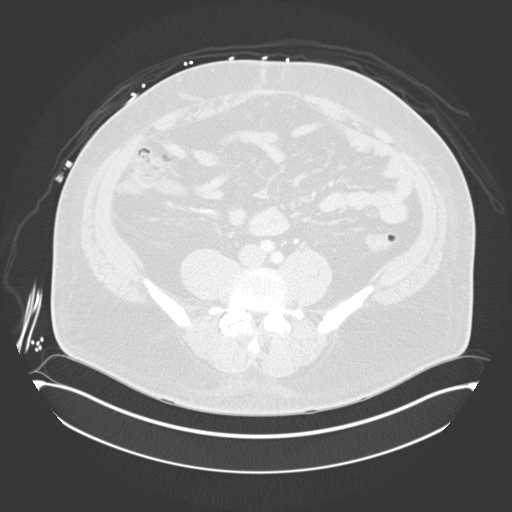
[im 78/104  soft-tissue]
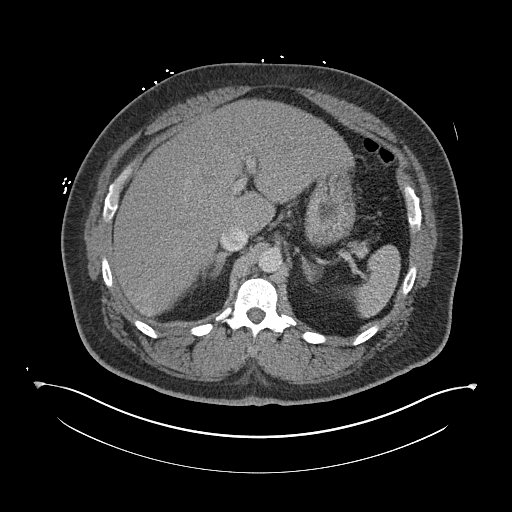
[im 78/104  lung]
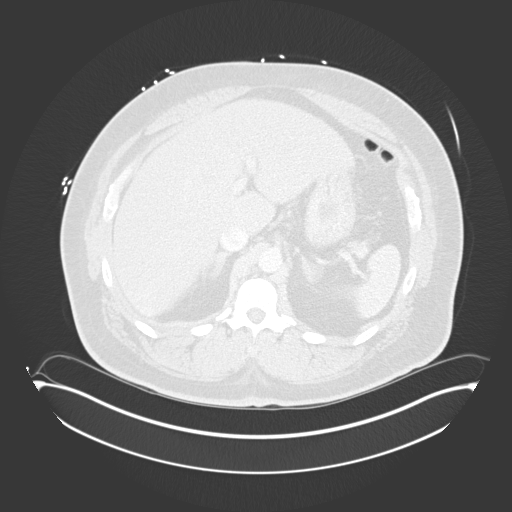

[12 of 46 positions shown; findings below may reference images not displayed]

RADIATION DOSE REDUCTION: This exam was performed according to the
departmental dose-optimization program which includes automated
exposure control, adjustment of the mA and/or kV according to
patient size and/or use of iterative reconstruction technique.

CONTRAST:  100mL OMNIPAQUE IOHEXOL 350 MG/ML SOLN
FINDINGS: CTA CHEST FINDINGS

Cardiovascular: Satisfactory opacification of the pulmonary mild
cardiomegaly. No pericardial disease. The thoracic aorta is
unremarkable. Normal heart size. No pericardial effusion.

Mediastinum/Nodes: Prominent mediastinal and hilar lymph nodes,
likely reactive.

Lungs/Pleura: Mosaic attenuation of the lungs with mild interlobular
septal thickening. There is a 5 mm right middle lobe pulmonary
nodule (series 7, image 78). There is diffuse mild bronchial wall
thickening. Small right pleural effusion. No pneumothorax.

Musculoskeletal: No acute osseous abnormality. No suspicious lytic
or blastic lesions.

Review of the MIP images confirms the above findings.

CT ABDOMEN and PELVIS FINDINGS

Hepatobiliary: Hepatic steatosis. Nonspecific gallbladder wall
thickening. The gallbladder is nondilated.

Pancreas: Unremarkable. No pancreatic ductal dilatation or
surrounding inflammatory changes.

Spleen: Normal in size without focal abnormality.

Adrenals/Urinary Tract: Adrenal glands are unremarkable. Multifocal
renal cortical thinning/scarring in the lower poles. There are small
subcentimeter bilateral renal cysts. There is no hydronephrosis or
nephrolithiasis. The bladder is unremarkable.

Stomach/Bowel: The stomach is within normal limits. There is mild
stranding along the descending duodenum (sagittal image 65, axial
image 42). There is no evidence of bowel obstruction.The appendix is
normal.

Vascular/Lymphatic: No significant vascular findings are present. No
enlarged abdominal or pelvic lymph nodes.

Reproductive: Unremarkable.

Other: Prior midline incision. No abdominopelvic ascites. No free
air.

Musculoskeletal: No acute or significant osseous findings.

Review of the MIP images confirms the above findings.
IMPRESSION: Mosaic attenuation of the lungs with mild interlobular septal
thickening could represent pulmonary edema and/or small airways
disease. Small right pleural effusion. No evidence of pulmonary
embolism.

Mild stranding along the descending duodenum, possibly representing
focal duodenitis.

Hepatic steatosis. Nonspecific gallbladder wall thickening but
possibly related to hepatic disease or volume overload.

## 2022-10-19 IMAGING — CT CT ANGIO CHEST
2 of 6 series · 17 of 46 positions shown · IV contrast (agent unspecified)
Comparison: CT abdomen pelvis 02/21/2016

CLINICAL DATA: Pulmonary embolism (PE) suspected, positive D-dimer;
Abdominal pain, acute, nonlocalized



[Series 509: thins · axial · 0.89mm/px · z∈[-465,-161]mm · 14 of 334 slices shown]
[im 15/334  lung]
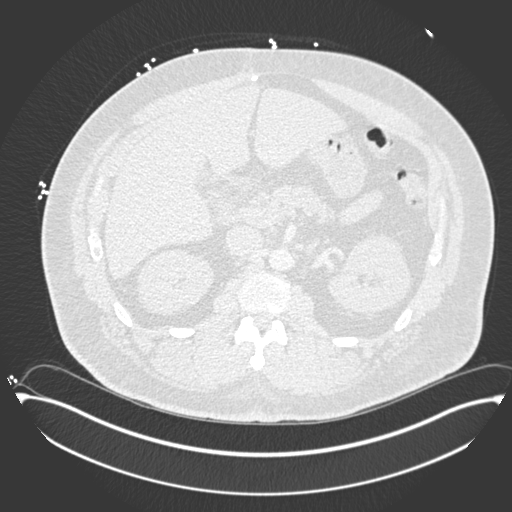
[im 44/334  soft-tissue]
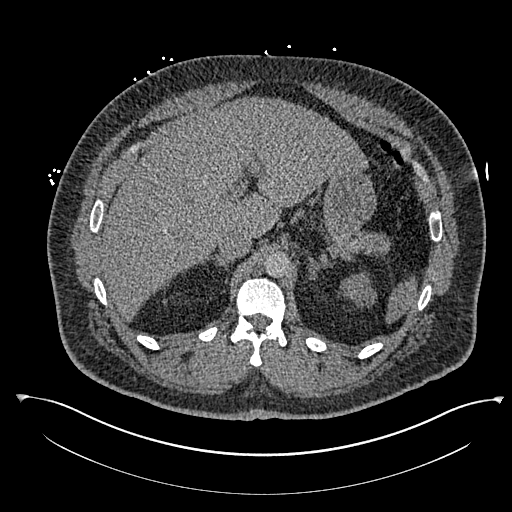
[im 58/334  lung]
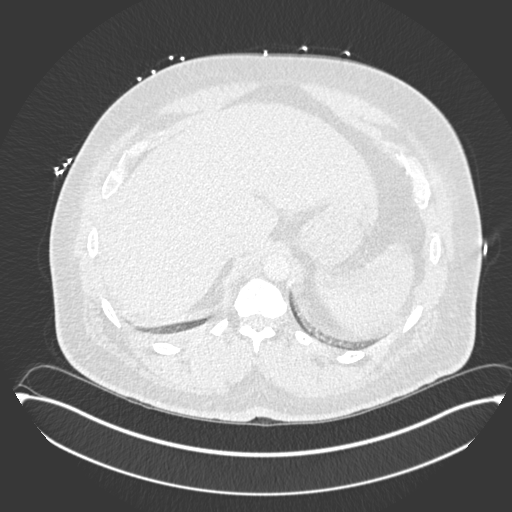
[im 87/334  soft-tissue]
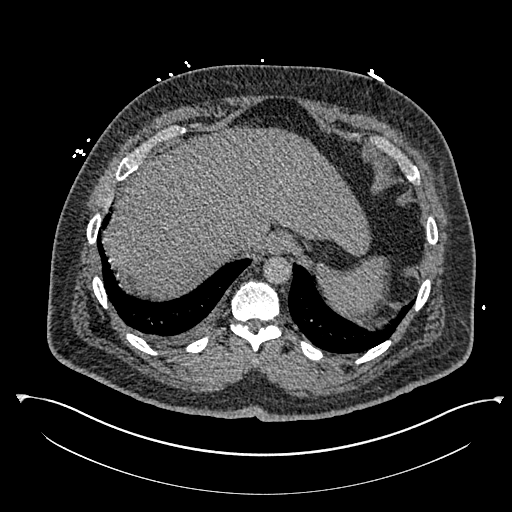
[im 116/334  lung]
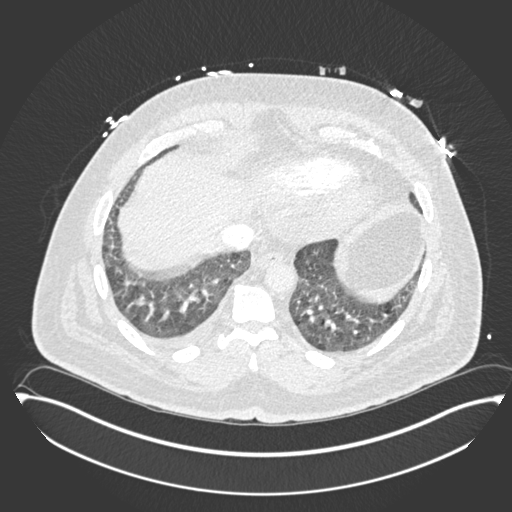
[im 131/334  soft-tissue]
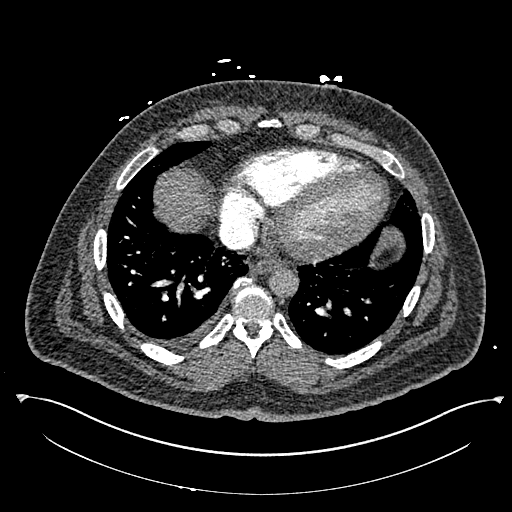
[im 160/334  lung]
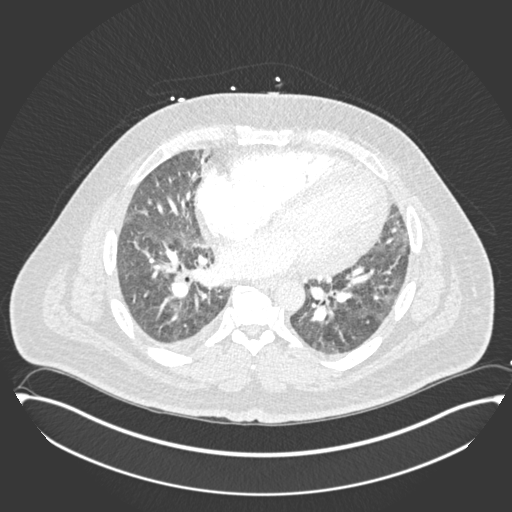
[im 174/334  soft-tissue]
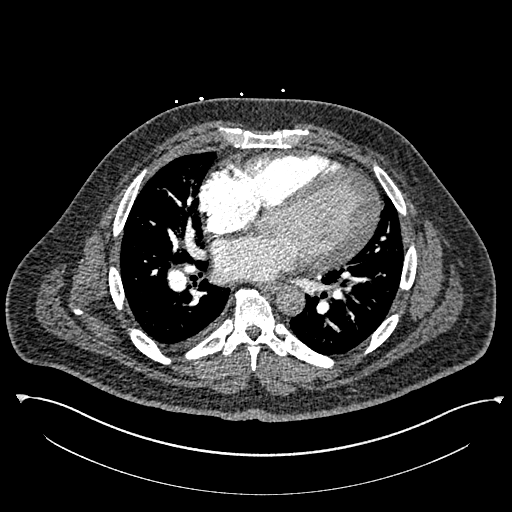
[im 203/334  lung]
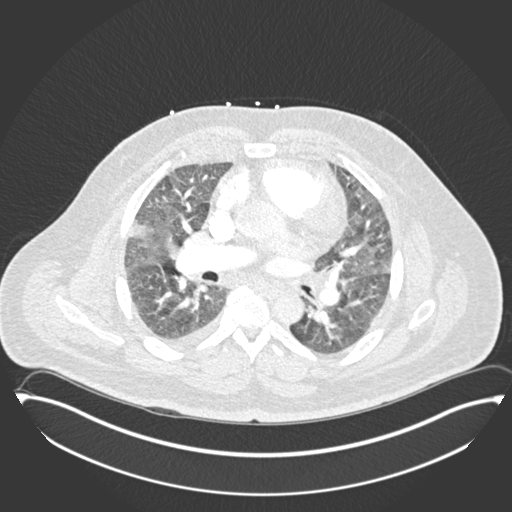
[im 218/334  soft-tissue]
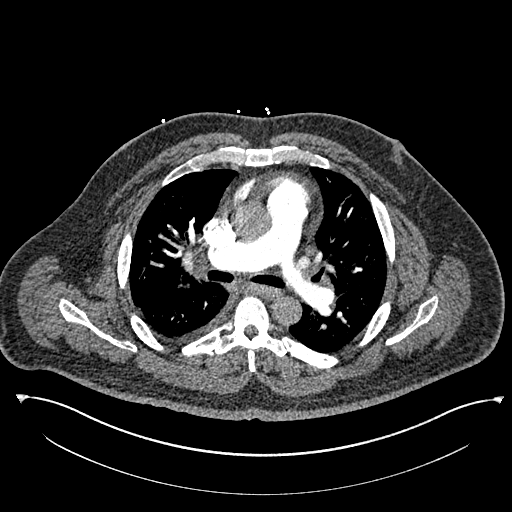
[im 247/334  lung]
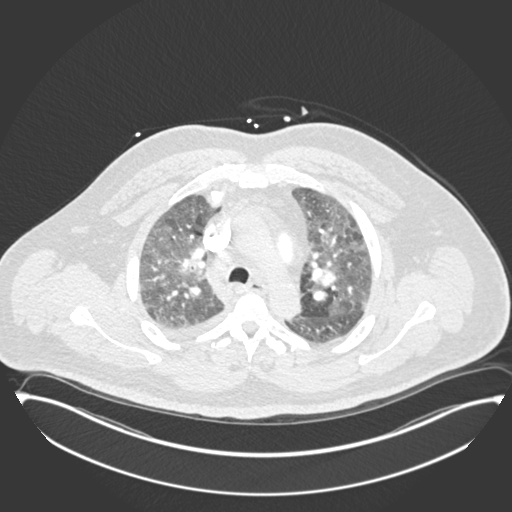
[im 276/334  soft-tissue]
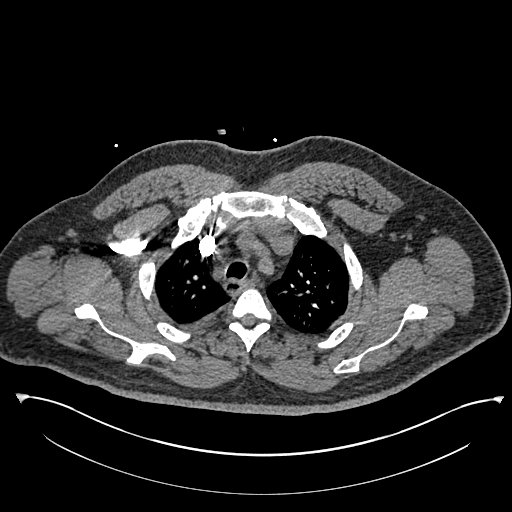
[im 290/334  lung]
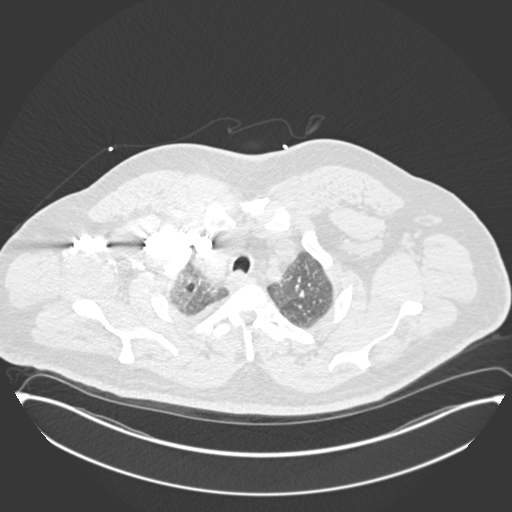
[im 319/334  soft-tissue]
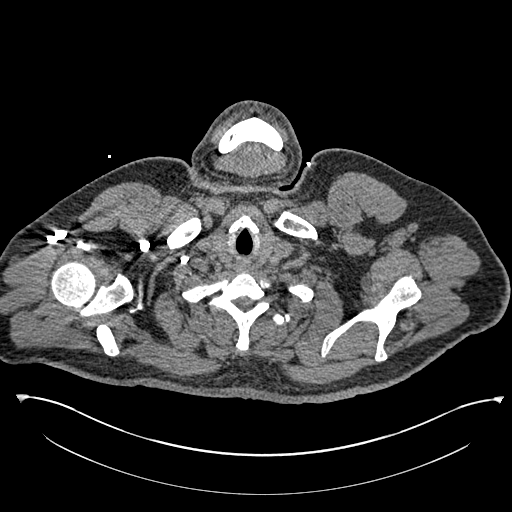

[Series 511: coronal mpr · coronal · 0.72mm/px · 3 of 176 slices shown]
[im 44/176  soft-tissue]
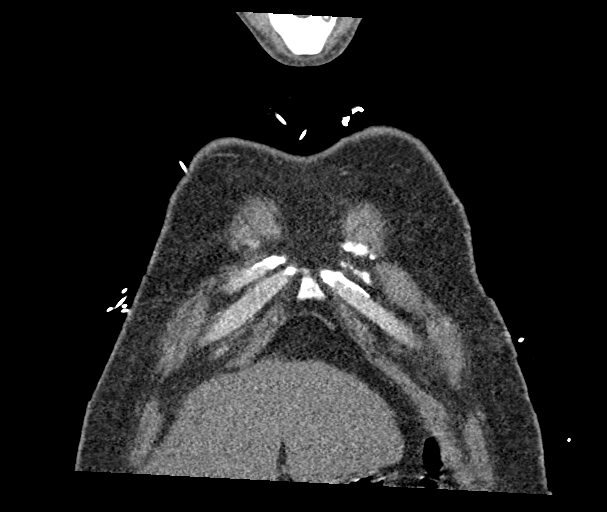
[im 88/176  soft-tissue]
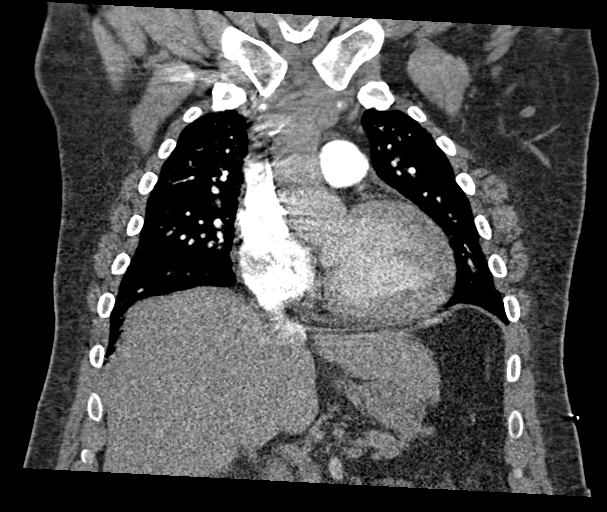
[im 132/176  soft-tissue]
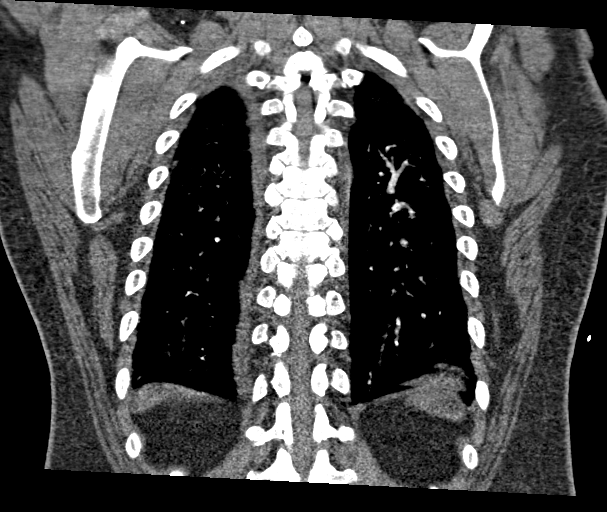

[17 of 46 positions shown; findings below may reference images not displayed]

RADIATION DOSE REDUCTION: This exam was performed according to the
departmental dose-optimization program which includes automated
exposure control, adjustment of the mA and/or kV according to
patient size and/or use of iterative reconstruction technique.

CONTRAST:  100mL OMNIPAQUE IOHEXOL 350 MG/ML SOLN
FINDINGS: CTA CHEST FINDINGS

Cardiovascular: Satisfactory opacification of the pulmonary mild
cardiomegaly. No pericardial disease. The thoracic aorta is
unremarkable. Normal heart size. No pericardial effusion.

Mediastinum/Nodes: Prominent mediastinal and hilar lymph nodes,
likely reactive.

Lungs/Pleura: Mosaic attenuation of the lungs with mild interlobular
septal thickening. There is a 5 mm right middle lobe pulmonary
nodule (series 7, image 78). There is diffuse mild bronchial wall
thickening. Small right pleural effusion. No pneumothorax.

Musculoskeletal: No acute osseous abnormality. No suspicious lytic
or blastic lesions.

Review of the MIP images confirms the above findings.

CT ABDOMEN and PELVIS FINDINGS

Hepatobiliary: Hepatic steatosis. Nonspecific gallbladder wall
thickening. The gallbladder is nondilated.

Pancreas: Unremarkable. No pancreatic ductal dilatation or
surrounding inflammatory changes.

Spleen: Normal in size without focal abnormality.

Adrenals/Urinary Tract: Adrenal glands are unremarkable. Multifocal
renal cortical thinning/scarring in the lower poles. There are small
subcentimeter bilateral renal cysts. There is no hydronephrosis or
nephrolithiasis. The bladder is unremarkable.

Stomach/Bowel: The stomach is within normal limits. There is mild
stranding along the descending duodenum (sagittal image 65, axial
image 42). There is no evidence of bowel obstruction.The appendix is
normal.

Vascular/Lymphatic: No significant vascular findings are present. No
enlarged abdominal or pelvic lymph nodes.

Reproductive: Unremarkable.

Other: Prior midline incision. No abdominopelvic ascites. No free
air.

Musculoskeletal: No acute or significant osseous findings.

Review of the MIP images confirms the above findings.
IMPRESSION: Mosaic attenuation of the lungs with mild interlobular septal
thickening could represent pulmonary edema and/or small airways
disease. Small right pleural effusion. No evidence of pulmonary
embolism.

Mild stranding along the descending duodenum, possibly representing
focal duodenitis.

Hepatic steatosis. Nonspecific gallbladder wall thickening but
possibly related to hepatic disease or volume overload.

## 2023-01-05 IMAGING — CT CT RENAL STONE PROTOCOL
2 of 4 series · 16 of 46 positions shown, 18 images · non-contrast
Comparison: CT October 31, 2021

CLINICAL DATA: Flank pain, renal stone suspected.



[Series 5: cor · coronal · 0.85mm/px · 3 of 116 slices shown]
[im 39/116  soft-tissue]
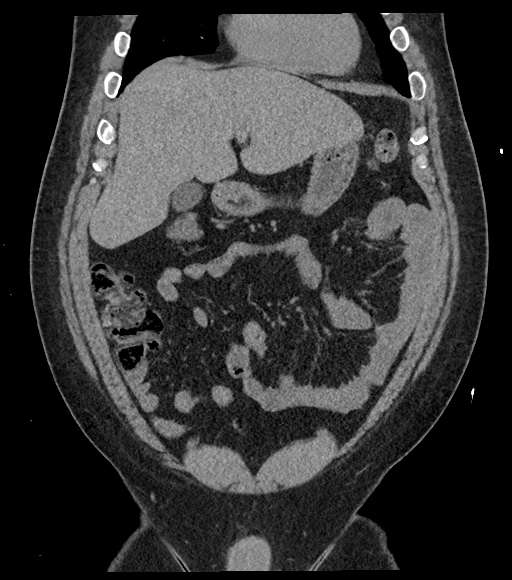
[im 52/116  soft-tissue]
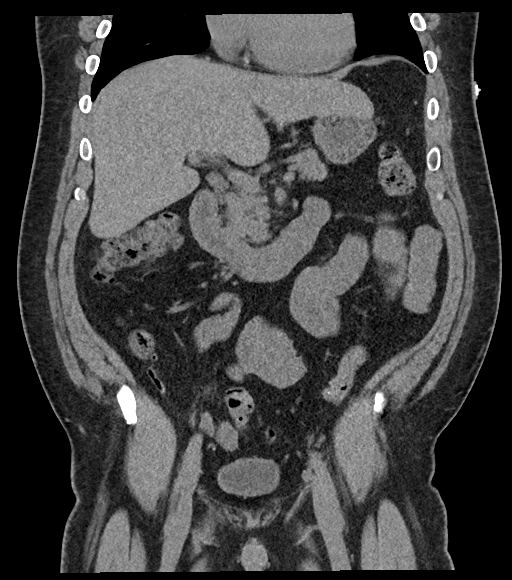
[im 64/116  soft-tissue]
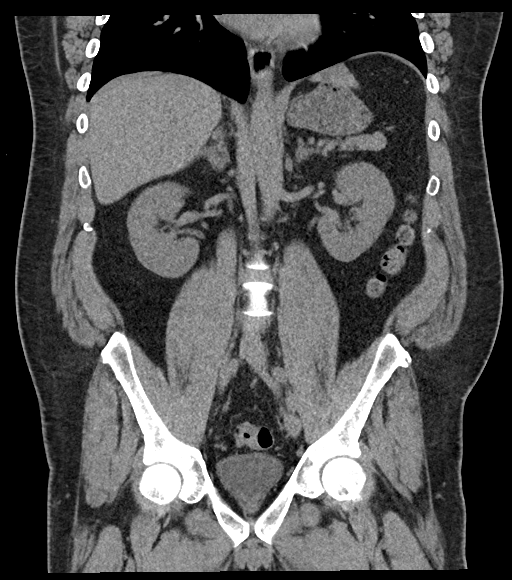

[Series 7: ap without · axial · non-contrast · 0.84mm/px · z∈[+898,+1323]mm · 13 of 95 slices shown, 15 images]
[im 5/95  soft-tissue]
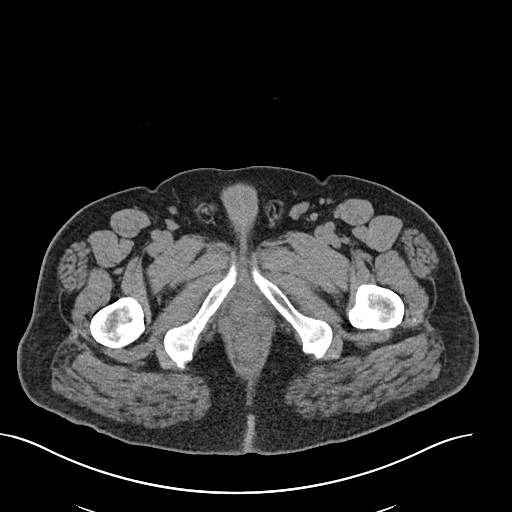
[im 5/95  bone]
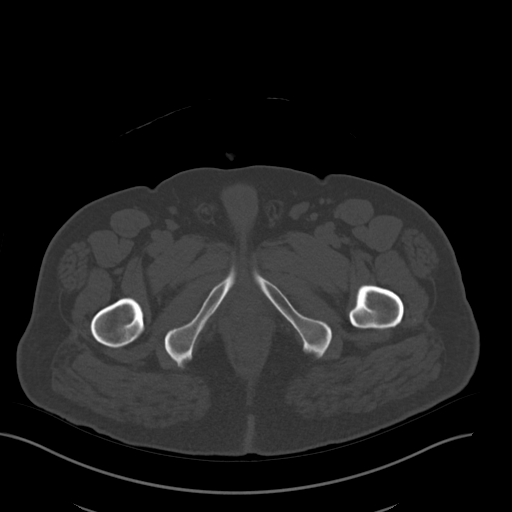
[im 15/95  soft-tissue]
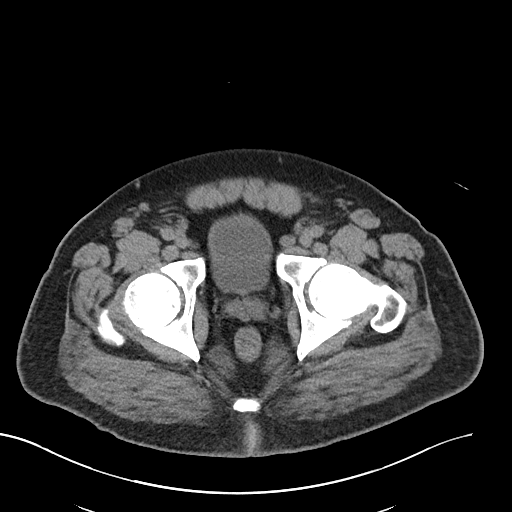
[im 19/95  soft-tissue]
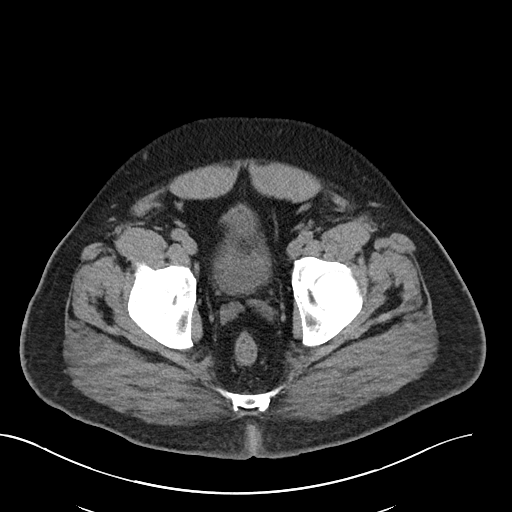
[im 29/95  soft-tissue]
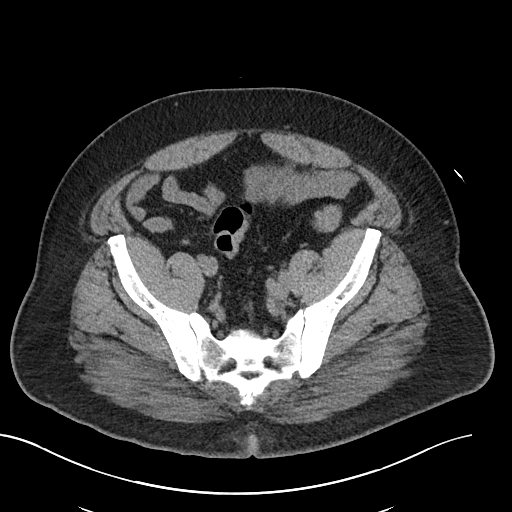
[im 33/95  soft-tissue]
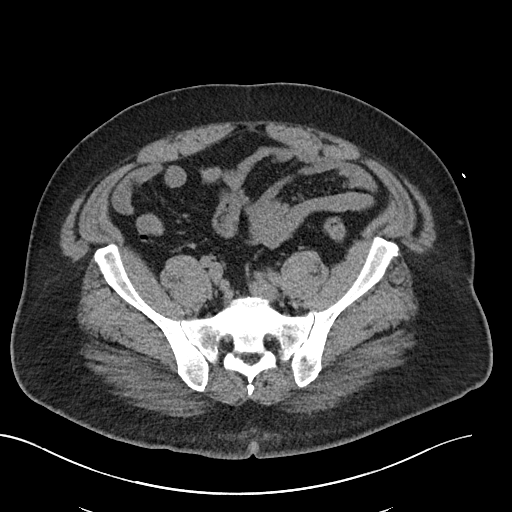
[im 43/95  soft-tissue]
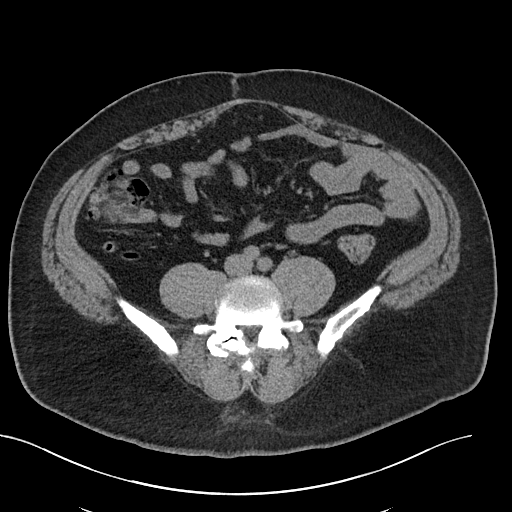
[im 48/95  soft-tissue]
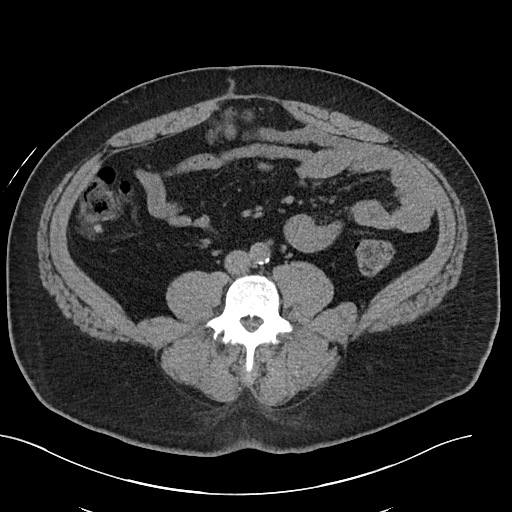
[im 52/95  soft-tissue]
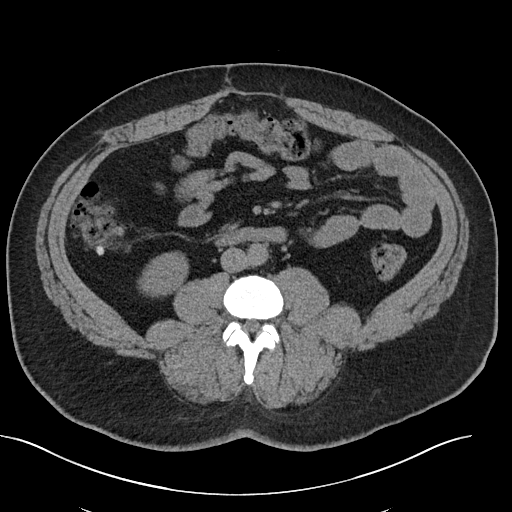
[im 62/95  soft-tissue]
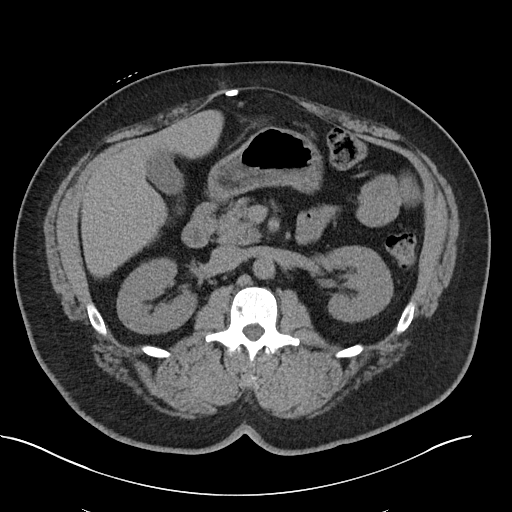
[im 62/95  bone]
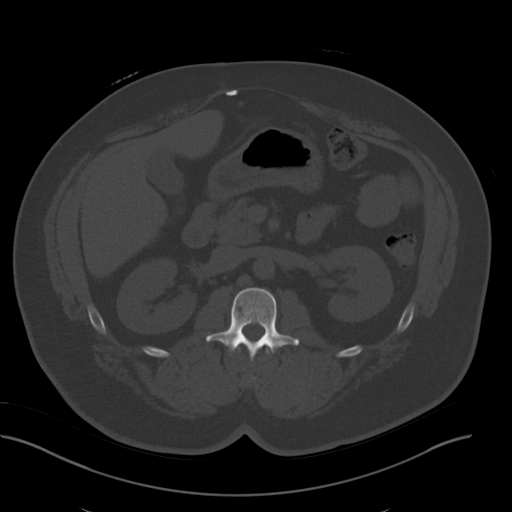
[im 66/95  soft-tissue]
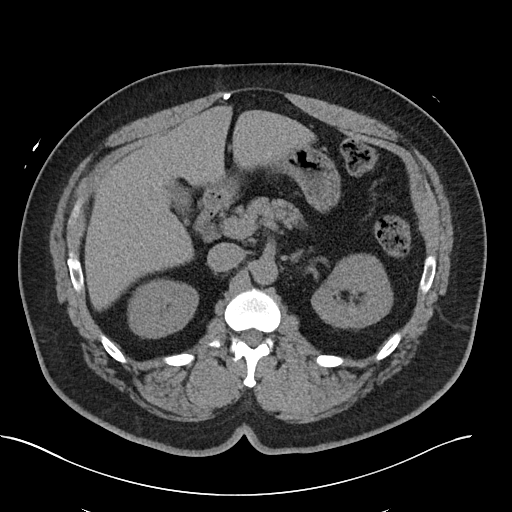
[im 76/95  soft-tissue]
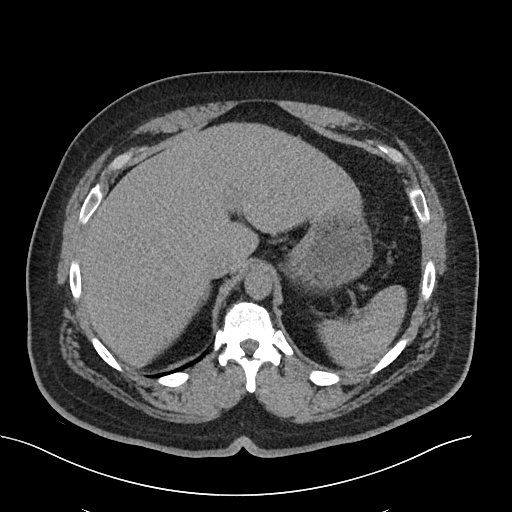
[im 80/95  soft-tissue]
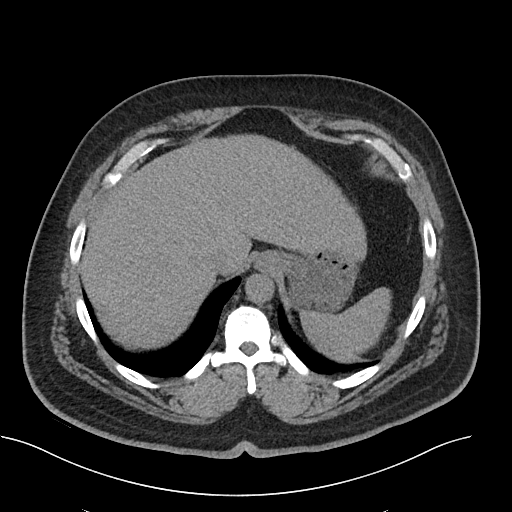
[im 90/95  soft-tissue]
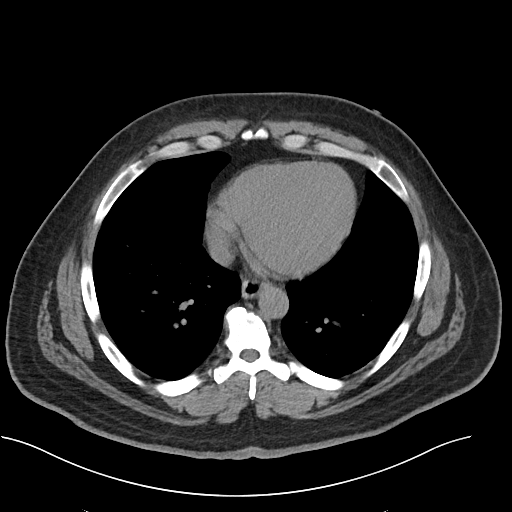

[16 of 46 positions shown; findings below may reference images not displayed]

FINDINGS: Lower chest: Mosaic attenuation in the lung bases may reflect small
airways disease. Trace right pleural effusion.

Hepatobiliary: Unremarkable noncontrast appearance of the hepatic
parenchyma. Gallbladder is unremarkable. No biliary ductal dilation.

Pancreas: No pancreatic ductal dilation or evidence of acute
inflammation.

Spleen: No splenomegaly or focal splenic lesion.

Adrenals/Urinary Tract: Bilateral adrenal glands appear normal. No
hydronephrosis. No renal, ureteral or bladder calculi. Urinary
bladder is unremarkable for degree of distension.

Stomach/Bowel: No radiopaque enteric contrast material was
administered. Fibrofatty infiltration along the wall gastric antrum.
No pathologic dilation of small or large bowel. The appendix and
terminal ileum appear normal. Colonic diverticulosis without
findings of acute diverticulitis.

Vascular/Lymphatic: Scattered aortic atherosclerosis without
abdominal aortic aneurysm. No pathologically enlarged abdominal or
pelvic lymph nodes.

Reproductive: Prostate is unremarkable.

Other: Postsurgical change in the anterior abdominal wall. No
pneumoperitoneum. No portal venous gas. Significant abdominopelvic
free fluid. No walled off fluid collections.

Musculoskeletal: No acute osseous abnormality.
IMPRESSION: 1. No acute abnormality in the abdomen or pelvis.
2. Fibrofatty infiltration along the wall of the gastric antrum
likely reflects sequela of chronic inflammation
3. Colonic diverticulosis without findings of acute diverticulitis.
4. Mosaic attenuation of the lung bases may reflect chronic small
airway disease.
